# Patient Record
Sex: Female | Born: 1970
Health system: Southern US, Community
[De-identification: ages and names within clinical notes are randomized; demographics above are authoritative.]

## PROBLEM LIST (undated history)

## (undated) DIAGNOSIS — D649 Anemia, unspecified: Secondary | ICD-10-CM

## (undated) DIAGNOSIS — J449 Chronic obstructive pulmonary disease, unspecified: Secondary | ICD-10-CM

## (undated) DIAGNOSIS — K56609 Unspecified intestinal obstruction, unspecified as to partial versus complete obstruction: Secondary | ICD-10-CM

## (undated) DIAGNOSIS — F32A Depression, unspecified: Secondary | ICD-10-CM

## (undated) DIAGNOSIS — F329 Major depressive disorder, single episode, unspecified: Secondary | ICD-10-CM

## (undated) DIAGNOSIS — Z9289 Personal history of other medical treatment: Secondary | ICD-10-CM

## (undated) HISTORY — PX: TUBAL LIGATION: SHX77

## (undated) HISTORY — PX: LACERATION REPAIR: SHX5168

## (undated) HISTORY — PX: ABDOMINAL HYSTERECTOMY: SHX81

## (undated) HISTORY — PX: WISDOM TOOTH EXTRACTION: SHX21

---

## 2010-11-04 DIAGNOSIS — K56609 Unspecified intestinal obstruction, unspecified as to partial versus complete obstruction: Secondary | ICD-10-CM

## 2010-11-04 HISTORY — DX: Unspecified intestinal obstruction, unspecified as to partial versus complete obstruction: K56.609

## 2010-11-04 HISTORY — PX: SMALL INTESTINE SURGERY: SHX150

## 2011-11-28 ENCOUNTER — Emergency Department (HOSPITAL_COMMUNITY): Payer: Self-pay

## 2011-11-28 ENCOUNTER — Emergency Department (HOSPITAL_COMMUNITY)
Admission: EM | Admit: 2011-11-28 | Discharge: 2011-11-28 | Disposition: A | Discharge status: Home / Self Care | Payer: Self-pay | Attending: Emergency Medicine | Admitting: Emergency Medicine

## 2011-11-28 ENCOUNTER — Encounter (HOSPITAL_COMMUNITY): Payer: Self-pay | Admitting: Emergency Medicine

## 2011-11-28 DIAGNOSIS — R0602 Shortness of breath: Secondary | ICD-10-CM | POA: Insufficient documentation

## 2011-11-28 DIAGNOSIS — R05 Cough: Secondary | ICD-10-CM | POA: Insufficient documentation

## 2011-11-28 DIAGNOSIS — R079 Chest pain, unspecified: Secondary | ICD-10-CM | POA: Insufficient documentation

## 2011-11-28 DIAGNOSIS — R059 Cough, unspecified: Secondary | ICD-10-CM | POA: Insufficient documentation

## 2011-11-28 HISTORY — DX: Anemia, unspecified: D64.9

## 2011-11-28 HISTORY — DX: Unspecified intestinal obstruction, unspecified as to partial versus complete obstruction: K56.609

## 2011-11-28 MED ORDER — HYDROCOD POLST-CHLORPHEN POLST 10-8 MG/5ML PO LQCR: 5.0000 mL | Freq: Every evening | ORAL | Status: DC | PRN

## 2011-11-28 NOTE — ED Notes (Signed)
Pt reports wheezing a lot at night or when lying down. No wheezing at this time.

## 2011-11-28 NOTE — ED Provider Notes (Signed)
History     CSN: [id number]  Arrival date & time 11/28/11  1242   First MD Initiated Contact with Patient 11/28/11 1308      Chief Complaint  Patient presents with  . Cough    (Consider location/radiation/quality/duration/timing/severity/associated sxs/prior treatment) Patient is a 41 y.o. female presenting with cough. The history is provided by the patient.  Cough This is a new problem. Episode onset: One month ago. The problem occurs hourly. The problem has not changed since onset.The cough is non-productive. There has been no fever. Associated symptoms include rhinorrhea. Pertinent negatives include no chest pain, no chills, no sweats, no weight loss, no ear congestion, no ear pain, no headaches, no sore throat, no myalgias, no shortness of breath, no wheezing and no eye redness. She has tried decongestants and cough syrup for the symptoms. The treatment provided mild relief. She is not a smoker. Her past medical history does not include COPD, emphysema or asthma.    Past Medical History  Diagnosis Date  . Anemia   . Bowel obstruction 2012    Past Surgical History  Procedure Date  . Abdominal hysterectomy   . Small intestine surgery 2012    No family history on file.  History  Substance Use Topics  . Smoking status: Never Smoker   . Smokeless tobacco: Not on file  . Alcohol Use: No     Review of Systems  Constitutional: Negative for fever, chills and weight loss.  HENT: Positive for rhinorrhea. Negative for ear pain, congestion, sore throat, trouble swallowing, neck pain and neck stiffness.   Eyes: Negative for pain and redness.  Respiratory: Positive for cough. Negative for shortness of breath and wheezing.   Cardiovascular: Negative for chest pain.  Gastrointestinal: Negative for nausea, vomiting and abdominal pain.  Musculoskeletal: Negative for myalgias and back pain.  Skin: Negative for rash.  Neurological: Negative for dizziness, syncope, weakness and  headaches.  Hematological:       Hx anemia  Psychiatric/Behavioral: Negative for confusion.    Allergies  Review of patient's allergies indicates no known allergies.  Home Medications   Current Outpatient Rx  Name Route Sig Dispense Refill  . DEXTROMETHORPHAN POLISTIREX ER 30 MG/5ML PO LQCR Oral Take 60 mg by mouth as needed.    Marland Kitchen VICKS DAYQUIL COLD & FLU PO Oral Take 2 capsules by mouth 2 (two) times daily.    . GUAIFENESIN 100 MG/5ML PO LIQD Oral Take 200 mg by mouth 2 (two) times daily.      BP 139/99  Pulse 82  Temp(Src) 97.5 F (36.4 C) (Oral)  Resp 16  SpO2 100%  Physical Exam  Nursing note and vitals reviewed. Constitutional: She is oriented to person, place, and time. She appears well-developed and well-nourished. No distress.  HENT:  Head: Normocephalic and atraumatic.  Right Ear: External ear normal.  Left Ear: External ear normal.  Nose: Nose normal.  Mouth/Throat: Oropharynx is clear and moist. No oropharyngeal exudate.       Bilateral TM normal  Eyes: Conjunctivae are normal. Pupils are equal, round, and reactive to light. Right eye exhibits no discharge. Left eye exhibits no discharge.  Neck: Normal range of motion. Neck supple.  Cardiovascular: Normal rate, regular rhythm and normal heart sounds.   No murmur heard. Pulmonary/Chest: Effort normal and breath sounds normal. No respiratory distress. She has no wheezes. She exhibits no tenderness.  Abdominal: Soft. Bowel sounds are normal. She exhibits no distension. There is no tenderness.  Musculoskeletal: She exhibits  no edema and no tenderness.  Lymphadenopathy:    She has no cervical adenopathy.  Neurological: She is alert and oriented to person, place, and time. No cranial nerve deficit.  Skin: Skin is warm and dry.  Psychiatric: She has a normal mood and affect.    ED Course  Procedures (including critical care time)  Labs Reviewed - No data to display Dg Chest 2 View  11/28/2011  *RADIOLOGY  REPORT*  Clinical Data: Cough, chest pain and shortness of breath.  CHEST - 2 VIEW  Comparison: None.  Findings: Trachea is midline.  Heart size normal.  Lungs are clear. No pleural fluid.  IMPRESSION: No acute findings.  Original Report Authenticated By: Reyes Ivan, M.D.    Dx 1:cough   MDM  Cough x 1 month. CXR reviewed, no evidence of pneumonia or other process to explain cough. When questioned, pt admits to having a bad/acidic taste in her throat in the mornings on occasion but denies having any trouble with indigestion or reflux. Suspect allergic cause of cough vs poss mild reflux. Advised a trial use of OTC antacid medication to see if it helps symptoms. Will also give rx for tussionex for nighttime use as she reports poor sleeping secondary to cough.        [address] [place], [place] 11/28/11 [phone]

## 2011-11-28 NOTE — ED Notes (Signed)
Pt reports cough x 30 days, states has been taking robitussin and delsym PRN when cough was bad. Pt states cough has always been nonproductive and when she coughs a lot it "makes me have a headache". Pt states occasional runny nose. Pt reports difficulty sleeping r/t cough.

## 2011-11-29 NOTE — ED Provider Notes (Signed)
Medical screening examination/treatment/procedure(s) were performed by non-physician practitioner and as supervising physician I was immediately available for consultation/collaboration.  Raeford Razor, MD 11/29/11 [phone]

## 2012-05-20 ENCOUNTER — Emergency Department (HOSPITAL_COMMUNITY)
Admission: EM | Admit: 2012-05-20 | Discharge: 2012-05-20 | Disposition: A | Discharge status: Home / Self Care | Payer: Medicaid Other | Attending: Emergency Medicine | Admitting: Emergency Medicine

## 2012-05-20 ENCOUNTER — Encounter (HOSPITAL_COMMUNITY): Payer: Self-pay | Admitting: Emergency Medicine

## 2012-05-20 DIAGNOSIS — Z8249 Family history of ischemic heart disease and other diseases of the circulatory system: Secondary | ICD-10-CM | POA: Insufficient documentation

## 2012-05-20 DIAGNOSIS — Z833 Family history of diabetes mellitus: Secondary | ICD-10-CM | POA: Insufficient documentation

## 2012-05-20 DIAGNOSIS — K0510 Chronic gingivitis, plaque induced: Secondary | ICD-10-CM | POA: Insufficient documentation

## 2012-05-20 DIAGNOSIS — Z90710 Acquired absence of both cervix and uterus: Secondary | ICD-10-CM | POA: Insufficient documentation

## 2012-05-20 MED ORDER — HYDROCODONE-ACETAMINOPHEN 5-325 MG PO TABS: 1.0000 | ORAL_TABLET | Freq: Four times a day (QID) | ORAL | Status: AC | PRN

## 2012-05-20 MED ORDER — CHLORHEXIDINE GLUCONATE 0.12 % MT SOLN: OROMUCOSAL | Status: DC

## 2012-05-20 MED ORDER — IBUPROFEN 800 MG PO TABS
800.0000 mg | ORAL_TABLET | Freq: Once | ORAL | Status: AC
  Administered 2012-05-20: 800 mg via ORAL
  Filled 2012-05-20: qty 1

## 2012-05-20 NOTE — ED Provider Notes (Signed)
History     CSN: [id number]  Arrival date & time 05/20/12  1478   First MD Initiated Contact with Patient 05/20/12 [phone]      Chief Complaint  Patient presents with  . Dental Pain    (Consider location/radiation/quality/duration/timing/severity/associated sxs/prior treatment) Patient is a 41 y.o. female presenting with tooth pain. The history is provided by the patient.  Dental PainThe primary symptoms include mouth pain. Primary symptoms do not include dental injury, oral bleeding, headaches, fever or sore throat. The symptoms began yesterday. The symptoms are new. The symptoms occur constantly.  Affected locations include: gum(s) and teeth (lower central teeth (canines and incisors primarily)).  Additional symptoms include: gum swelling and gum tenderness. Additional symptoms do not include: trismus, facial swelling, excessive salivation, dry mouth and drooling. Medical issues do not include: alcohol problem, smoking and periodontal disease.  Pt brushes regularly.  Does not floss.  Has not seen a dentist in a while for dental care.  Past Medical History  Diagnosis Date  . Anemia   . Bowel obstruction 2012    Past Surgical History  Procedure Date  . Abdominal hysterectomy   . Small intestine surgery 2012    Family History  Problem Relation Age of Onset  . Diabetes Other   . Hypertension Other     History  Substance Use Topics  . Smoking status: Never Smoker   . Smokeless tobacco: Not on file  . Alcohol Use: No    OB History    Grav Para Term Preterm Abortions TAB SAB Ect Mult Living                  Review of Systems  Constitutional: Negative for fever.  HENT: Negative for sore throat, facial swelling and drooling.   Neurological: Negative for headaches.  All other systems reviewed and are negative.    Allergies  Review of patient's allergies indicates no known allergies.  Home Medications   Current Outpatient Rx  Name Route Sig Dispense Refill  .  ACETAMINOPHEN 500 MG PO TABS Oral Take 500 mg by mouth every 6 (six) hours as needed.    Marland Kitchen HYDROCOD POLST-CPM POLST ER 10-8 MG/5ML PO LQCR Oral Take 5 mLs by mouth at bedtime as needed. 100 mL 0    BP 135/87  Pulse 80  Temp 98.1 F (36.7 C) (Oral)  Resp 16  SpO2 99%  Physical Exam  Nursing note and vitals reviewed. Constitutional: She appears well-developed and well-nourished. No distress.  HENT:  Head: Normocephalic and atraumatic. No trismus in the jaw.  Right Ear: External ear normal.  Left Ear: External ear normal.  Mouth/Throat: Uvula is midline. Mucous membranes are not pale and not cyanotic. No dental abscesses, uvula swelling or dental caries. No oropharyngeal exudate or tonsillar abscesses.       gingival inflammation surrounding lower central incisors and canines, increased plaque and debris between teeth, halitosis  Eyes: Conjunctivae are normal. Right eye exhibits no discharge. Left eye exhibits no discharge. No scleral icterus.  Neck: Neck supple. No tracheal deviation present.  Cardiovascular: Normal rate.   Pulmonary/Chest: Effort normal. No stridor. No respiratory distress.  Musculoskeletal: She exhibits no edema.  Neurological: She is alert. Cranial nerve deficit: no gross deficits.  Skin: Skin is warm and dry. No rash noted.  Psychiatric: She has a normal mood and affect.    ED Course  Procedures (including critical care time)  Labs Reviewed - No data to display No results found.  MDM  Symptoms consistent with gingivitis.  Recc salt water gargles.  Will rx chlorhexidine. Continue gentle dental care.  Follow up with a dentist asap.        Celene Kras, MD 05/20/12 0730

## 2012-05-20 NOTE — ED Notes (Signed)
Pt states she is having mouth and gum pain  Pt states she woke up yesterday morning with pain that has progressively gotten worse  Pt guarding mouth  Pt states she does not think she has any bad teeth

## 2012-10-18 ENCOUNTER — Emergency Department (HOSPITAL_COMMUNITY): Payer: Medicaid Other

## 2012-10-18 ENCOUNTER — Encounter (HOSPITAL_COMMUNITY): Payer: Self-pay | Admitting: *Deleted

## 2012-10-18 ENCOUNTER — Emergency Department (HOSPITAL_COMMUNITY)
Admission: EM | Admit: 2012-10-18 | Discharge: 2012-10-18 | Disposition: A | Discharge status: Home / Self Care | Payer: Medicaid Other | Attending: Emergency Medicine | Admitting: Emergency Medicine

## 2012-10-18 DIAGNOSIS — Z862 Personal history of diseases of the blood and blood-forming organs and certain disorders involving the immune mechanism: Secondary | ICD-10-CM | POA: Insufficient documentation

## 2012-10-18 DIAGNOSIS — R7401 Elevation of levels of liver transaminase levels: Secondary | ICD-10-CM | POA: Insufficient documentation

## 2012-10-18 DIAGNOSIS — Z8719 Personal history of other diseases of the digestive system: Secondary | ICD-10-CM | POA: Insufficient documentation

## 2012-10-18 DIAGNOSIS — R7402 Elevation of levels of lactic acid dehydrogenase (LDH): Secondary | ICD-10-CM | POA: Insufficient documentation

## 2012-10-18 DIAGNOSIS — R4789 Other speech disturbances: Secondary | ICD-10-CM | POA: Insufficient documentation

## 2012-10-18 DIAGNOSIS — R109 Unspecified abdominal pain: Secondary | ICD-10-CM | POA: Insufficient documentation

## 2012-10-18 DIAGNOSIS — K921 Melena: Secondary | ICD-10-CM | POA: Insufficient documentation

## 2012-10-18 LAB — CBC WITH DIFFERENTIAL/PLATELET
Basophils Relative: 0 % (ref 0–1)
Eosinophils Absolute: 0.2 10*3/uL (ref 0.0–0.7)
MCH: 27.9 pg (ref 26.0–34.0)
MCHC: 34.3 g/dL (ref 30.0–36.0)
Neutrophils Relative %: 64 % (ref 43–77)
Platelets: 279 10*3/uL (ref 150–400)
RBC: 5.78 MIL/uL — ABNORMAL HIGH (ref 3.87–5.11)

## 2012-10-18 LAB — COMPREHENSIVE METABOLIC PANEL
ALT: 54 U/L — ABNORMAL HIGH (ref 0–35)
Albumin: 4.3 g/dL (ref 3.5–5.2)
Alkaline Phosphatase: 157 U/L — ABNORMAL HIGH (ref 39–117)
Potassium: 3.5 mEq/L (ref 3.5–5.1)
Sodium: 137 mEq/L (ref 135–145)
Total Protein: 9.1 g/dL — ABNORMAL HIGH (ref 6.0–8.3)

## 2012-10-18 NOTE — ED Notes (Signed)
Patient discharge to home.  Pain at tolerable level.  Vitals stable.  Discussed discharge instructions.  Patient verbalized understanding.  No other concerns at this time.  Evelyn Kline 6:09 PM 10/18/2012

## 2012-10-18 NOTE — ED Notes (Signed)
Patient stated she informed the MD that she is unable to get the abdominal US due to needing to pick up children from school.  Will follow up with Md on discharge instructions.  Barrie Lyme 6:12 PM 10/18/2012

## 2012-10-18 NOTE — ED Provider Notes (Signed)
History     CSN: [id number]  Arrival date & time 10/18/12  1342   First MD Initiated Contact with Patient 10/18/12 1635      Chief Complaint  Patient presents with  . Diarrhea    (Consider location/radiation/quality/duration/timing/severity/associated sxs/prior treatment) HPI The patient presents with concerns of ongoing loose stool, and possible blood in her stool today.  She notes her symptoms began approximately 2 months ago.  Since onset symptoms have been persistent.  There was no clear precipitant.  She notes mild, non-associated, intermittent, diffuse, crampy abdominal pain.  This is possibly improved with OTC medication.  She denies concurrent fevers, chills, vomiting, anorexia.  She has seen a specialist during this timeframe.  Today the patient one episode of visible blood with stool.  She denies current lightheadedness, syncope, chest pain, dyspnea, or any other new focal changes in her condition. Past Medical History  Diagnosis Date  . Anemia   . Bowel obstruction 2012    Past Surgical History  Procedure Date  . Abdominal hysterectomy   . Small intestine surgery 2012    Family History  Problem Relation Age of Onset  . Diabetes Other   . Hypertension Other     History  Substance Use Topics  . Smoking status: Never Smoker   . Smokeless tobacco: Not on file  . Alcohol Use: No    OB History    Grav Para Term Preterm Abortions TAB SAB Ect Mult Living                  Review of Systems  Constitutional:       Per HPI, otherwise negative  HENT:       Per HPI, otherwise negative  Eyes: Negative.   Respiratory:       Per HPI, otherwise negative  Cardiovascular:       Per HPI, otherwise negative  Gastrointestinal: Positive for blood in stool. Negative for vomiting.  Genitourinary: Negative.   Musculoskeletal:       Per HPI, otherwise negative  Skin: Negative.   Neurological: Negative for syncope.    Allergies  Review of patient's allergies  indicates no known allergies.  Home Medications   Current Outpatient Rx  Name  Route  Sig  Dispense  Refill  . SERTRALINE HCL 50 MG PO TABS   Oral   Take 50 mg by mouth every morning.           BP 131/90  Pulse 86  Temp 98.6 F (37 C) (Oral)  Resp 20  Ht 5\' 6"  (1.676 m)  Wt 156 lb (70.761 kg)  BMI 25.18 kg/m2  SpO2 99%  Physical Exam  Nursing note and vitals reviewed. Constitutional: She is oriented to person, place, and time. She appears well-developed and well-nourished. No distress.  HENT:  Head: Normocephalic and atraumatic.  Eyes: Conjunctivae normal and EOM are normal.  Cardiovascular: Normal rate and regular rhythm.   Pulmonary/Chest: Effort normal and breath sounds normal. No stridor. No respiratory distress.  Abdominal: She exhibits no distension.  Musculoskeletal: She exhibits no edema.  Neurological: She is alert and oriented to person, place, and time. No cranial nerve deficit.  Skin: Skin is warm and dry.  Psychiatric: She has a normal mood and affect. Her speech is delayed. She is slowed and withdrawn.    ED Course  Procedures (including critical care time)  Labs Reviewed  CBC WITH DIFFERENTIAL - Abnormal; Notable for the following:    RBC 5.78 (*)  Hemoglobin 16.1 (*)     HCT 46.9 (*)     All other components within normal limits  COMPREHENSIVE METABOLIC PANEL - Abnormal; Notable for the following:    Total Protein 9.1 (*)     AST 51 (*)     ALT 54 (*)     Alkaline Phosphatase 157 (*)     All other components within normal limits   No results found.   1. Elevated transaminase level     6:03 PM The patient requests D/c due to family concerns.  She was informed of results thus far, and the need for additional studies and PE (rectal).  She states that she will f/u tomorrow with her gastroenterologist.  MDM  This patient presents with concerns of ongoing loose stool, new blood with defecation.  On exam she is in no distress.  Patient  signs are unremarkable.  He has a gastroenterologist, with them she has been investigating her newly chronic loose stool.  On evaluation here, she was in no distress.  The patient's initial evaluation is notable for the elevation of elevated transaminases.  I recommend additional evaluation with ultrasound.  The patient deferred this, as she has family commitments, and requested discharge.  She is advised to return for any concerning changes in her condition, and to followup with her gastroenterologist otherwise.        Gerhard Munch, MD 10/18/12 [phone]

## 2012-10-18 NOTE — ED Notes (Signed)
[id number]<JY> Expected date:<BR> Expected time:<BR> Means of arrival:<BR> Comments:<BR> [facility] triage

## 2012-10-18 NOTE — ED Notes (Signed)
Pt reports diarrhea x's a few months and noticed blood in stool today. Pt also reports slight abd pain.

## 2012-11-16 ENCOUNTER — Other Ambulatory Visit: Payer: Self-pay | Admitting: Gastroenterology

## 2012-11-16 DIAGNOSIS — R102 Pelvic and perineal pain: Secondary | ICD-10-CM

## 2012-11-16 DIAGNOSIS — R109 Unspecified abdominal pain: Secondary | ICD-10-CM

## 2012-11-19 ENCOUNTER — Ambulatory Visit
Admission: RE | Admit: 2012-11-19 | Discharge: 2012-11-19 | Disposition: A | Discharge status: Home / Self Care | Payer: Medicaid Other | Source: Ambulatory Visit | Attending: Gastroenterology | Admitting: Gastroenterology

## 2012-11-19 ENCOUNTER — Other Ambulatory Visit: Payer: Medicaid Other

## 2012-11-19 DIAGNOSIS — R109 Unspecified abdominal pain: Secondary | ICD-10-CM

## 2012-11-19 DIAGNOSIS — R102 Pelvic and perineal pain: Secondary | ICD-10-CM

## 2012-11-22 ENCOUNTER — Emergency Department (INDEPENDENT_AMBULATORY_CARE_PROVIDER_SITE_OTHER)
Admission: EM | Admit: 2012-11-22 | Discharge: 2012-11-22 | Disposition: A | Discharge status: Home / Self Care | Payer: Medicaid Other | Source: Home / Self Care | Attending: Emergency Medicine | Admitting: Emergency Medicine

## 2012-11-22 ENCOUNTER — Encounter (HOSPITAL_COMMUNITY): Payer: Self-pay | Admitting: *Deleted

## 2012-11-22 DIAGNOSIS — R51 Headache: Secondary | ICD-10-CM

## 2012-11-22 DIAGNOSIS — J069 Acute upper respiratory infection, unspecified: Secondary | ICD-10-CM

## 2012-11-22 HISTORY — DX: Major depressive disorder, single episode, unspecified: F32.9

## 2012-11-22 HISTORY — DX: Depression, unspecified: F32.A

## 2012-11-22 LAB — POCT URINALYSIS DIP (DEVICE)
Bilirubin Urine: NEGATIVE
Hgb urine dipstick: NEGATIVE
Leukocytes, UA: NEGATIVE
pH: 5.5 (ref 5.0–8.0)

## 2012-11-22 MED ORDER — KETOROLAC TROMETHAMINE 60 MG/2ML IM SOLN
60.0000 mg | Freq: Once | INTRAMUSCULAR | Status: AC
  Administered 2012-11-22: 60 mg via INTRAMUSCULAR

## 2012-11-22 MED ORDER — KETOROLAC TROMETHAMINE 60 MG/2ML IM SOLN
INTRAMUSCULAR | Status: AC
  Filled 2012-11-22: qty 2

## 2012-11-22 NOTE — ED Notes (Signed)
Patient complains of headache that started yesterday with no relief from ibuprofen; patient also states that she have fever/chills, chest pain and congestion with cough, shortness of breath, back and neck pain from headache that all started Saturday. Denies nausea, vomiting, diarrhea.

## 2012-11-22 NOTE — ED Provider Notes (Signed)
History     CSN: [id number]  Arrival date & time 11/22/12  1213   First MD Initiated Contact with Patient 11/22/12 1428      Chief Complaint  Patient presents with  . Headache  . URI    (Consider location/radiation/quality/duration/timing/severity/associated sxs/prior treatment) Patient is a 42 y.o. female presenting with headaches and URI. The history is provided by the patient.  Headache The primary symptoms include headaches. Primary symptoms do not include paresthesias. The symptoms began yesterday. The symptoms are unchanged. The neurological symptoms are focal.  Location/region(s) of the headache: temporal. The headache is not associated with aura, neck stiffness, paresthesias or weakness.  Additional symptoms include vertigo. Additional symptoms do not include neck stiffness, weakness, aura or tinnitus. Associated medical issues comments: depression.  URI The primary symptoms include headaches.  The headache is not associated with aura, neck stiffness, paresthesias or weakness.  works as Scientist, forensic, additional complains of mid back pain.  Past Medical History  Diagnosis Date  . Anemia   . Bowel obstruction 2012  . Depression     Past Surgical History  Procedure Date  . Abdominal hysterectomy   . Small intestine surgery 2012    Family History  Problem Relation Age of Onset  . Diabetes Other   . Hypertension Other     History  Substance Use Topics  . Smoking status: Never Smoker   . Smokeless tobacco: Not on file  . Alcohol Use: No    OB History    Grav Para Term Preterm Abortions TAB SAB Ect Mult Living                  Review of Systems  HENT: Negative for neck stiffness and tinnitus.   Neurological: Positive for vertigo and headaches. Negative for weakness and paresthesias.  All other systems reviewed and are negative.    Allergies  Review of patient's allergies indicates no known allergies.  Home Medications   Current  Outpatient Rx  Name  Route  Sig  Dispense  Refill  . SERTRALINE HCL 50 MG PO TABS   Oral   Take 50 mg by mouth every morning.           BP 114/73  Pulse 96  Temp 98 F (36.7 C) (Oral)  Resp 14  SpO2 96%  Physical Exam  Nursing note and vitals reviewed. Constitutional: She is oriented to person, place, and time. Vital signs are normal. She appears well-developed and well-nourished. She is active and cooperative.  HENT:  Head: Normocephalic.  Right Ear: External ear normal.  Left Ear: External ear normal.  Nose: Nose normal. Right sinus exhibits no maxillary sinus tenderness and no frontal sinus tenderness. Left sinus exhibits no maxillary sinus tenderness and no frontal sinus tenderness.  Mouth/Throat: Uvula is midline, oropharynx is clear and moist and mucous membranes are normal.       Bilateral cerumen impaction  Eyes: Conjunctivae normal and EOM are normal. Pupils are equal, round, and reactive to light. No scleral icterus.  Neck: Trachea normal and normal range of motion. Neck supple. No spinous process tenderness and no muscular tenderness present.  Cardiovascular: Normal rate, regular rhythm, normal heart sounds, intact distal pulses and normal pulses.   Pulmonary/Chest: Effort normal and breath sounds normal.  Musculoskeletal: Normal range of motion.       Cervical back: Normal.       Thoracic back: Normal.       Lumbar back: Normal.  Lymphadenopathy:  Head (right side): No submental, no submandibular, no tonsillar, no preauricular, no posterior auricular and no occipital adenopathy present.       Head (left side): No submental, no submandibular, no tonsillar, no preauricular, no posterior auricular and no occipital adenopathy present.    She has no cervical adenopathy.  Neurological: She is alert and oriented to person, place, and time. She has normal strength. No cranial nerve deficit or sensory deficit. GCS eye subscore is 4. GCS verbal subscore is 5. GCS motor  subscore is 6.  Skin: Skin is warm and dry. No rash noted.  Psychiatric: She has a normal mood and affect. Her speech is normal and behavior is normal. Judgment and thought content normal. Cognition and memory are normal.    ED Course  Procedures (including critical care time)  Labs Reviewed  POCT URINALYSIS DIP (DEVICE) - Abnormal; Notable for the following:    Ketones, ur TRACE (*)     Protein, ur 30 (*)     All other components within normal limits  POCT RAPID STREP A ([facility] CARE ONLY)  POCT INFECTIOUS MONO SCREEN  POCT PREGNANCY, URINE   No results found.   1. Headache   2. URI (upper respiratory infection)       MDM  Patient with depressed affect, nonspecific poor historian of symptoms.  No abnormal neurological findings on exam.  Believe symptoms may be related to uri mixed with probable viral syndrome.   Toradol 60mg  IM administered in office for headache and midback pain.  1632 pain improved. Medication as prescribed, follow up with PCP if symptoms are not improved.  Discussed reasons to return sooner for further evaluation.          Johnsie Kindred, NP 11/22/12 [phone]

## 2012-11-23 NOTE — ED Provider Notes (Signed)
Medical screening examination/treatment/procedure(s) were performed by non-physician practitioner and as supervising physician I was immediately available for consultation/collaboration.  Leslee Home, M.D.   Reuben Likes, MD 11/23/12 [phone]

## 2012-11-24 ENCOUNTER — Encounter (HOSPITAL_COMMUNITY): Payer: Self-pay

## 2012-11-24 ENCOUNTER — Emergency Department (HOSPITAL_COMMUNITY)
Admission: EM | Admit: 2012-11-24 | Discharge: 2012-11-24 | Disposition: A | Discharge status: Home / Self Care | Payer: Medicaid Other | Attending: Emergency Medicine | Admitting: Emergency Medicine

## 2012-11-24 DIAGNOSIS — Z79899 Other long term (current) drug therapy: Secondary | ICD-10-CM | POA: Insufficient documentation

## 2012-11-24 DIAGNOSIS — Z8719 Personal history of other diseases of the digestive system: Secondary | ICD-10-CM | POA: Insufficient documentation

## 2012-11-24 DIAGNOSIS — R5383 Other fatigue: Secondary | ICD-10-CM | POA: Insufficient documentation

## 2012-11-24 DIAGNOSIS — F3289 Other specified depressive episodes: Secondary | ICD-10-CM | POA: Insufficient documentation

## 2012-11-24 DIAGNOSIS — R112 Nausea with vomiting, unspecified: Secondary | ICD-10-CM | POA: Insufficient documentation

## 2012-11-24 DIAGNOSIS — B349 Viral infection, unspecified: Secondary | ICD-10-CM

## 2012-11-24 DIAGNOSIS — Z862 Personal history of diseases of the blood and blood-forming organs and certain disorders involving the immune mechanism: Secondary | ICD-10-CM | POA: Insufficient documentation

## 2012-11-24 DIAGNOSIS — R51 Headache: Secondary | ICD-10-CM | POA: Insufficient documentation

## 2012-11-24 DIAGNOSIS — J029 Acute pharyngitis, unspecified: Secondary | ICD-10-CM | POA: Insufficient documentation

## 2012-11-24 DIAGNOSIS — M549 Dorsalgia, unspecified: Secondary | ICD-10-CM | POA: Insufficient documentation

## 2012-11-24 DIAGNOSIS — R5381 Other malaise: Secondary | ICD-10-CM | POA: Insufficient documentation

## 2012-11-24 DIAGNOSIS — B9789 Other viral agents as the cause of diseases classified elsewhere: Secondary | ICD-10-CM | POA: Insufficient documentation

## 2012-11-24 DIAGNOSIS — F329 Major depressive disorder, single episode, unspecified: Secondary | ICD-10-CM | POA: Insufficient documentation

## 2012-11-24 MED ORDER — HYDROCODONE-ACETAMINOPHEN 5-325 MG PO TABS: ORAL_TABLET | ORAL | Status: DC

## 2012-11-24 MED ORDER — ONDANSETRON 8 MG PO TBDP: 8.0000 mg | ORAL_TABLET | Freq: Once | ORAL | Status: DC

## 2012-11-24 MED ORDER — AEROCHAMBER Z-STAT PLUS/MEDIUM MISC
1.0000 | Freq: Once | Status: AC
  Administered 2012-11-24: 1

## 2012-11-24 MED ORDER — ONDANSETRON 4 MG PO TBDP
ORAL_TABLET | ORAL | Status: AC
  Administered 2012-11-24: 8 mg
  Filled 2012-11-24: qty 1

## 2012-11-24 MED ORDER — ONDANSETRON HCL 4 MG/2ML IJ SOLN: 4.0000 mg | Freq: Once | INTRAMUSCULAR | Status: DC

## 2012-11-24 MED ORDER — ONDANSETRON 4 MG PO TBDP
ORAL_TABLET | ORAL | Status: AC
  Filled 2012-11-24: qty 1

## 2012-11-24 MED ORDER — KETOROLAC TROMETHAMINE 60 MG/2ML IM SOLN
60.0000 mg | Freq: Once | INTRAMUSCULAR | Status: AC
  Administered 2012-11-24: 60 mg via INTRAMUSCULAR
  Filled 2012-11-24: qty 2

## 2012-11-24 MED ORDER — ALBUTEROL SULFATE HFA 108 (90 BASE) MCG/ACT IN AERS
2.0000 | INHALATION_SPRAY | RESPIRATORY_TRACT | Status: DC | PRN
  Administered 2012-11-24: 2 via RESPIRATORY_TRACT
  Filled 2012-11-24: qty 6.7

## 2012-11-24 MED ORDER — GUAIFENESIN 100 MG/5ML PO SYRP: 100.0000 mg | ORAL_SOLUTION | ORAL | Status: DC | PRN

## 2012-11-24 NOTE — ED Notes (Addendum)
Patient reports back pain, vomiting, and a non productive cough. Patient denies dysuria, fever, or diarrhea. Patient was seen at [facility] urgent care on the 11/23/11 and was diagnosed with URI and was given a "shot for back pain." patient states she does not feel any better.

## 2012-11-24 NOTE — ED Provider Notes (Signed)
History     CSN: [id number]  Arrival date & time 11/24/12  1478   First MD Initiated Contact with Patient 11/24/12 [phone]      Chief Complaint  Patient presents with  . Back Pain  . Cough  . Emesis    (Consider location/radiation/quality/duration/timing/severity/associated sxs/prior treatment) HPI Comments: Patient presents with 4 days of cough, vomiting, and back pain in her middle back. She was seen at urgent care 2 days ago with the same complaints. She states that she is not any better. Patient denies fever, diarrhea. Patient states that she has not been able to keep down any fluids or solids for 3 days. She denies abdominal pain. Patient states that she took Vicodin that she had left over from oral surgery to "get rid of the pain". Cough is nonproductive. Onset of symptoms gradual. Course is constant. Nothing makes symptoms better worse. She's been taking over-the-counter medications without relief as well.  The history is provided by the patient.    Past Medical History  Diagnosis Date  . Anemia   . Bowel obstruction 2012  . Depression     Past Surgical History  Procedure Date  . Abdominal hysterectomy   . Small intestine surgery 2012    Family History  Problem Relation Age of Onset  . Diabetes Other   . Hypertension Other     History  Substance Use Topics  . Smoking status: Never Smoker   . Smokeless tobacco: Never Used  . Alcohol Use: No    OB History    Grav Para Term Preterm Abortions TAB SAB Ect Mult Living                  Review of Systems  Constitutional: Positive for fatigue. Negative for fever and chills.  HENT: Positive for sore throat. Negative for ear pain, congestion, rhinorrhea, neck stiffness and sinus pressure.   Eyes: Negative for redness.  Respiratory: Positive for cough and wheezing. Negative for shortness of breath.   Cardiovascular: Negative for chest pain.  Gastrointestinal: Positive for nausea and vomiting. Negative for abdominal  pain and diarrhea.  Genitourinary: Negative for dysuria.  Musculoskeletal: Positive for back pain. Negative for myalgias.  Skin: Negative for rash.  Neurological: Positive for headaches.  Hematological: Negative for adenopathy.    Allergies  Review of patient's allergies indicates no known allergies.  Home Medications   Current Outpatient Rx  Name  Route  Sig  Dispense  Refill  . SERTRALINE HCL 50 MG PO TABS   Oral   Take 50 mg by mouth every morning.           BP 109/86  Pulse 73  Temp 98.3 F (36.8 C) (Oral)  Resp 18  SpO2 96%  Physical Exam  Nursing note and vitals reviewed. Constitutional: She appears well-developed and well-nourished.  HENT:  Head: Normocephalic and atraumatic. No trismus in the jaw.  Right Ear: Tympanic membrane, external ear and ear canal normal.  Left Ear: Tympanic membrane, external ear and ear canal normal.  Nose: Nose normal. No mucosal edema or rhinorrhea.  Mouth/Throat: Uvula is midline, oropharynx is clear and moist and mucous membranes are normal. Mucous membranes are not dry. No oral lesions. No uvula swelling. No oropharyngeal exudate, posterior oropharyngeal edema, posterior oropharyngeal erythema or tonsillar abscesses.  Eyes: Conjunctivae normal are normal. Right eye exhibits no discharge. Left eye exhibits no discharge.  Neck: Normal range of motion. Neck supple.  Cardiovascular: Normal rate, regular rhythm and normal heart sounds.  Pulmonary/Chest: Effort normal and breath sounds normal. No respiratory distress. She has no wheezes. She has no rales.  Abdominal: Soft. There is no tenderness.  Musculoskeletal: She exhibits tenderness.       Cervical back: Normal. She exhibits normal range of motion, no tenderness and no bony tenderness.       Thoracic back: She exhibits tenderness. She exhibits normal range of motion and no bony tenderness.       Lumbar back: Normal. She exhibits normal range of motion, no tenderness and no bony  tenderness.       Back:  Lymphadenopathy:    She has no cervical adenopathy.  Neurological: She is alert.  Skin: Skin is warm and dry.  Psychiatric: She exhibits a depressed mood.    ED Course  Procedures (including critical care time)  Labs Reviewed - No data to display No results found.   1. Viral syndrome     10:12 AM Patient seen and examined. Medications ordered.   Vital signs reviewed and are as follows: Filed Vitals:   11/24/12 0923  BP: 109/86  Pulse: 73  Temp: 98.3 F (36.8 C)  Resp: 18   Patient states that she is improved after Toradol. She has been drinking fluids in room without vomiting. Will discharge home with cough medicine and pain medication.  Patient counseled on use of narcotic pain medications. Counseled not to combine these medications with others containing tylenol. Urged not to drink alcohol, drive, or perform any other activities that requires focus while taking these medications. The patient verbalizes understanding and agrees with the plan.  Patient counseled on use of albuterol HFA.  Told to use 1-2 puffs q 4 hours as needed for SOB.   MDM  Patient with viral syndrome. She is tolerating oral fluids in the emergency department. She does not appear clinically dehydrated. Suspect component of bronchitis. Albuterol inhaler given. Patient needs to rest at home. Do not suspect pneumonia. Patient is afebrile. Conservative treatment indicated.       Renne Crigler, [place] 11/24/12 [phone]

## 2012-11-24 NOTE — ED Provider Notes (Signed)
Medical screening examination/treatment/procedure(s) were performed by non-physician practitioner and as supervising physician I was immediately available for consultation/collaboration.  Gerhard Munch, MD 11/24/12 [phone]

## 2013-08-11 ENCOUNTER — Emergency Department (HOSPITAL_BASED_OUTPATIENT_CLINIC_OR_DEPARTMENT_OTHER)
Admission: EM | Admit: 2013-08-11 | Discharge: 2013-08-11 | Disposition: A | Discharge status: Home / Self Care | Payer: Medicaid Other | Attending: Emergency Medicine | Admitting: Emergency Medicine

## 2013-08-11 ENCOUNTER — Encounter (HOSPITAL_BASED_OUTPATIENT_CLINIC_OR_DEPARTMENT_OTHER): Payer: Self-pay | Admitting: Emergency Medicine

## 2013-08-11 ENCOUNTER — Emergency Department (HOSPITAL_BASED_OUTPATIENT_CLINIC_OR_DEPARTMENT_OTHER): Payer: Medicaid Other

## 2013-08-11 DIAGNOSIS — S93401A Sprain of unspecified ligament of right ankle, initial encounter: Secondary | ICD-10-CM

## 2013-08-11 DIAGNOSIS — S93409A Sprain of unspecified ligament of unspecified ankle, initial encounter: Secondary | ICD-10-CM | POA: Insufficient documentation

## 2013-08-11 DIAGNOSIS — Y9289 Other specified places as the place of occurrence of the external cause: Secondary | ICD-10-CM | POA: Insufficient documentation

## 2013-08-11 DIAGNOSIS — Y939 Activity, unspecified: Secondary | ICD-10-CM | POA: Insufficient documentation

## 2013-08-11 DIAGNOSIS — F3289 Other specified depressive episodes: Secondary | ICD-10-CM | POA: Insufficient documentation

## 2013-08-11 DIAGNOSIS — F329 Major depressive disorder, single episode, unspecified: Secondary | ICD-10-CM | POA: Insufficient documentation

## 2013-08-11 DIAGNOSIS — Z8719 Personal history of other diseases of the digestive system: Secondary | ICD-10-CM | POA: Insufficient documentation

## 2013-08-11 DIAGNOSIS — Z79899 Other long term (current) drug therapy: Secondary | ICD-10-CM | POA: Insufficient documentation

## 2013-08-11 DIAGNOSIS — Z862 Personal history of diseases of the blood and blood-forming organs and certain disorders involving the immune mechanism: Secondary | ICD-10-CM | POA: Insufficient documentation

## 2013-08-11 DIAGNOSIS — W010XXA Fall on same level from slipping, tripping and stumbling without subsequent striking against object, initial encounter: Secondary | ICD-10-CM | POA: Insufficient documentation

## 2013-08-11 MED ORDER — IBUPROFEN 800 MG PO TABS: 800.0000 mg | ORAL_TABLET | Freq: Three times a day (TID) | ORAL | Status: DC

## 2013-08-11 NOTE — ED Notes (Signed)
Proper crutches use & education provided.  Patient demostrated walking with crutches.

## 2013-08-11 NOTE — ED Provider Notes (Signed)
CSN: [id number]     Arrival date & time 08/11/13  1344 History   First MD Initiated Contact with Patient 08/11/13 1443     Chief Complaint  Patient presents with  . Ankle Injury   (Consider location/radiation/quality/duration/timing/severity/associated sxs/prior Treatment) Patient is a 42 y.o. female presenting with lower extremity injury. The history is provided by the patient. No language interpreter was used.  Ankle Injury This is a new problem. The current episode started today. The problem occurs constantly. The problem has been gradually worsening. Associated symptoms include joint swelling and myalgias. Nothing aggravates the symptoms. She has tried nothing for the symptoms. The treatment provided moderate relief.  Pt complains of tripping and falling  Past Medical History  Diagnosis Date  . Anemia   . Bowel obstruction 2012  . Depression    Past Surgical History  Procedure Laterality Date  . Abdominal hysterectomy    . Small intestine surgery  2012   Family History  Problem Relation Age of Onset  . Diabetes Other   . Hypertension Other    History  Substance Use Topics  . Smoking status: Never Smoker   . Smokeless tobacco: Never Used  . Alcohol Use: No   OB History   Grav Para Term Preterm Abortions TAB SAB Ect Mult Living                 Review of Systems  Musculoskeletal: Positive for joint swelling and myalgias.  All other systems reviewed and are negative.    Allergies  Review of patient's allergies indicates no known allergies.  Home Medications   Current Outpatient Rx  Name  Route  Sig  Dispense  Refill  . sertraline (ZOLOFT) 50 MG tablet   Oral   Take 50 mg by mouth every morning.          BP 127/84  Temp(Src) 98.8 F (37.1 C)  Resp 18  Ht 5\' 6"  (1.676 m)  Wt 160 lb (72.576 kg)  BMI 25.84 kg/m2  SpO2 100% Physical Exam  Constitutional: She is oriented to person, place, and time. She appears well-developed and well-nourished.   Musculoskeletal: She exhibits tenderness.  Swollen tender right lateral malleolus,  Pain with range of motion,  nv and ns intact  Neurological: She is alert and oriented to person, place, and time. She has normal reflexes.  Skin: Skin is warm.  Psychiatric: She has a normal mood and affect.    ED Course  Procedures (including critical care time) Labs Review Labs Reviewed - No data to display Imaging Review Dg Ankle Complete Right  08/11/2013   CLINICAL DATA:  Pain post injury, lateral malleolus swelling  EXAM: RIGHT ANKLE - COMPLETE 3+ VIEW  COMPARISON:  None.  FINDINGS: Three views of the right ankle submitted. There is cortical irregularity at the tip of distal fibula suspicious for small avulsion fracture. Ankle mortise is preserved. Soft tissue swelling adjacent to lateral malleolus.  IMPRESSION: There is cortical irregularity at the tip of distal fibula suspicious for small avulsion fracture. Ankle mortise is preserved. Soft tissue swelling adjacent to lateral malleolus.   Electronically Signed   By: Natasha Mead M.D.   On: 08/11/2013 14:19    MDM   1. Ankle sprain, right, initial encounter     aso and crutches    Elson Areas, PA-C 08/11/13 1547

## 2013-08-11 NOTE — ED Notes (Signed)
Pt reports falling yesterday, tripped and fell onto grass from standing position. C/o of R ankle pain.

## 2013-08-13 NOTE — ED Provider Notes (Signed)
Medical screening examination/treatment/procedure(s) were performed by non-physician practitioner and as supervising physician I was immediately available for consultation/collaboration.  Hilliard Borges, MD 08/13/13 1911 

## 2013-08-23 ENCOUNTER — Emergency Department (HOSPITAL_BASED_OUTPATIENT_CLINIC_OR_DEPARTMENT_OTHER)
Admission: EM | Admit: 2013-08-23 | Discharge: 2013-08-23 | Disposition: A | Discharge status: Home / Self Care | Payer: Medicaid Other | Attending: Emergency Medicine | Admitting: Emergency Medicine

## 2013-08-23 ENCOUNTER — Encounter (HOSPITAL_BASED_OUTPATIENT_CLINIC_OR_DEPARTMENT_OTHER): Payer: Self-pay | Admitting: Emergency Medicine

## 2013-08-23 ENCOUNTER — Emergency Department (HOSPITAL_BASED_OUTPATIENT_CLINIC_OR_DEPARTMENT_OTHER): Payer: Medicaid Other

## 2013-08-23 DIAGNOSIS — F3289 Other specified depressive episodes: Secondary | ICD-10-CM | POA: Insufficient documentation

## 2013-08-23 DIAGNOSIS — S82899A Other fracture of unspecified lower leg, initial encounter for closed fracture: Secondary | ICD-10-CM | POA: Insufficient documentation

## 2013-08-23 DIAGNOSIS — F329 Major depressive disorder, single episode, unspecified: Secondary | ICD-10-CM | POA: Insufficient documentation

## 2013-08-23 DIAGNOSIS — Z8719 Personal history of other diseases of the digestive system: Secondary | ICD-10-CM | POA: Insufficient documentation

## 2013-08-23 DIAGNOSIS — S82831A Other fracture of upper and lower end of right fibula, initial encounter for closed fracture: Secondary | ICD-10-CM

## 2013-08-23 DIAGNOSIS — Y929 Unspecified place or not applicable: Secondary | ICD-10-CM | POA: Insufficient documentation

## 2013-08-23 DIAGNOSIS — Z791 Long term (current) use of non-steroidal anti-inflammatories (NSAID): Secondary | ICD-10-CM | POA: Insufficient documentation

## 2013-08-23 DIAGNOSIS — X58XXXA Exposure to other specified factors, initial encounter: Secondary | ICD-10-CM | POA: Insufficient documentation

## 2013-08-23 DIAGNOSIS — Z79899 Other long term (current) drug therapy: Secondary | ICD-10-CM | POA: Insufficient documentation

## 2013-08-23 DIAGNOSIS — Y939 Activity, unspecified: Secondary | ICD-10-CM | POA: Insufficient documentation

## 2013-08-23 DIAGNOSIS — Z862 Personal history of diseases of the blood and blood-forming organs and certain disorders involving the immune mechanism: Secondary | ICD-10-CM | POA: Insufficient documentation

## 2013-08-23 MED ORDER — TRAMADOL HCL 50 MG PO TABS: 50.0000 mg | ORAL_TABLET | Freq: Four times a day (QID) | ORAL | Status: DC | PRN

## 2013-08-23 NOTE — ED Notes (Signed)
Pt reports injury to right ankle 2 weeks ago, was seen in ED and continues to have pain.

## 2013-08-23 NOTE — ED Provider Notes (Signed)
CSN: [id number]     Arrival date & time 08/23/13  1121 History   First MD Initiated Contact with Patient 08/23/13 1148     Chief Complaint  Patient presents with  . Ankle Pain   (Consider location/radiation/quality/duration/timing/severity/associated sxs/prior Treatment) Patient is a 42 y.o. female presenting with ankle pain.  Ankle Pain  Pt reports seen about 2 weeks ago for ankle injury, treated for ankle sprain but still having moderate aching pain worse with walking, no new injury.   Past Medical History  Diagnosis Date  . Anemia   . Bowel obstruction 2012  . Depression    Past Surgical History  Procedure Laterality Date  . Abdominal hysterectomy    . Small intestine surgery  2012   Family History  Problem Relation Age of Onset  . Diabetes Other   . Hypertension Other    History  Substance Use Topics  . Smoking status: Never Smoker   . Smokeless tobacco: Never Used  . Alcohol Use: No   OB History   Grav Para Term Preterm Abortions TAB SAB Ect Mult Living                 Review of Systems All other systems reviewed and are negative except as noted in HPI.   Allergies  Review of patient's allergies indicates no known allergies.  Home Medications   Current Outpatient Rx  Name  Route  Sig  Dispense  Refill  . ibuprofen (ADVIL,MOTRIN) 800 MG tablet   Oral   Take 1 tablet (800 mg total) by mouth 3 (three) times daily.   21 tablet   0   . sertraline (ZOLOFT) 50 MG tablet   Oral   Take 50 mg by mouth every morning.          BP 122/88  Pulse 54  Resp 18  Ht 5\' 6"  (1.676 m)  Wt 160 lb (72.576 kg)  BMI 25.84 kg/m2  SpO2 98% Physical Exam  Constitutional: She is oriented to person, place, and time. She appears well-developed and well-nourished.  HENT:  Head: Normocephalic and atraumatic.  Neck: Neck supple.  Pulmonary/Chest: Effort normal.  Musculoskeletal: Normal range of motion. She exhibits tenderness (R lateral maleolus). She exhibits no edema.   Neurological: She is alert and oriented to person, place, and time. No cranial nerve deficit.  Psychiatric: She has a normal mood and affect. Her behavior is normal.    ED Course  Procedures (including critical care time) Labs Review Labs Reviewed - No data to display Imaging Review Dg Ankle Complete Right  08/23/2013   CLINICAL DATA:  Ankle pain. Recent lateral malleolar avulsion fracture.  EXAM: RIGHT ANKLE - COMPLETE 3+ VIEW  COMPARISON:  08/11/2013  FINDINGS: Again demonstrated a small avulsion fracture inferior to the tip of the lateral malleolus. The ankle mortise intact. The talar dome is normal. No joint effusion. Calcaneus is normal.  IMPRESSION: No significant change and avulsion fracture of the lateral malleolus.   Electronically Signed   By: Genevive Bi M.D.   On: 08/23/2013 12:56    EKG Interpretation   None       MDM   1. Closed avulsion fracture of distal end of right fibula     Xray again demonstrates avulsion fracture of malleolus. Cam walker and referral to Ortho for long term followup.     Paublo Warshawsky B. Bernette Mayers, MD 08/23/13 1314

## 2014-06-12 ENCOUNTER — Encounter (HOSPITAL_COMMUNITY): Payer: Self-pay | Admitting: Emergency Medicine

## 2014-06-12 ENCOUNTER — Emergency Department (HOSPITAL_COMMUNITY)
Admission: EM | Admit: 2014-06-12 | Discharge: 2014-06-12 | Disposition: A | Discharge status: Home / Self Care | Payer: Medicaid Other | Attending: Emergency Medicine | Admitting: Emergency Medicine

## 2014-06-12 DIAGNOSIS — K644 Residual hemorrhoidal skin tags: Secondary | ICD-10-CM | POA: Insufficient documentation

## 2014-06-12 DIAGNOSIS — Z8659 Personal history of other mental and behavioral disorders: Secondary | ICD-10-CM | POA: Diagnosis not present

## 2014-06-12 DIAGNOSIS — Z8719 Personal history of other diseases of the digestive system: Secondary | ICD-10-CM | POA: Insufficient documentation

## 2014-06-12 DIAGNOSIS — K6289 Other specified diseases of anus and rectum: Secondary | ICD-10-CM | POA: Insufficient documentation

## 2014-06-12 DIAGNOSIS — IMO0002 Reserved for concepts with insufficient information to code with codable children: Secondary | ICD-10-CM | POA: Diagnosis not present

## 2014-06-12 DIAGNOSIS — K645 Perianal venous thrombosis: Secondary | ICD-10-CM

## 2014-06-12 DIAGNOSIS — Z862 Personal history of diseases of the blood and blood-forming organs and certain disorders involving the immune mechanism: Secondary | ICD-10-CM | POA: Insufficient documentation

## 2014-06-12 MED ORDER — HYDROCORTISONE ACETATE 25 MG RE SUPP: 25.0000 mg | Freq: Two times a day (BID) | RECTAL | Status: DC

## 2014-06-12 MED ORDER — LIDOCAINE HCL 2 % EX GEL
1.0000 "application " | Freq: Once | CUTANEOUS | Status: AC
  Administered 2014-06-12: 1
  Filled 2014-06-12: qty 10

## 2014-06-12 MED ORDER — NAPROXEN 375 MG PO TABS: 375.0000 mg | ORAL_TABLET | Freq: Two times a day (BID) | ORAL | Status: DC

## 2014-06-12 NOTE — ED Notes (Signed)
She states she has a painful external hemorrhoid; plus constipation x 5 days.  She is in no distress.

## 2014-06-12 NOTE — ED Provider Notes (Signed)
CSN: [id number]     Arrival date & time 06/12/14  1154 History   First MD Initiated Contact with Patient 06/12/14 1223     Chief Complaint  Patient presents with  . Rectal Pain     (Consider location/radiation/quality/duration/timing/severity/associated sxs/prior Treatment) HPI Comments: 43 year old female with history of bowel obstruction, anemia, hysterectomy  rectal pain and constipation for the past 5 days. Patient denies history of hemorrhoids. Mild worse with bowel movements. No fevers abdominal pain or vomiting. Patient is passing gas.   The history is provided by the patient.    Past Medical History  Diagnosis Date  . Anemia   . Bowel obstruction 2012  . Depression    Past Surgical History  Procedure Laterality Date  . Abdominal hysterectomy    . Small intestine surgery  2012   Family History  Problem Relation Age of Onset  . Diabetes Other   . Hypertension Other    History  Substance Use Topics  . Smoking status: Never Smoker   . Smokeless tobacco: Never Used  . Alcohol Use: No   OB History   Grav Para Term Preterm Abortions TAB SAB Ect Mult Living                 Review of Systems  Constitutional: Negative for fever and chills.  HENT: Negative for congestion.   Eyes: Negative for visual disturbance.  Respiratory: Negative for shortness of breath.   Cardiovascular: Negative for chest pain.  Gastrointestinal: Negative for vomiting and abdominal pain.  Genitourinary: Negative for dysuria and flank pain.  Musculoskeletal: Negative for back pain, neck pain and neck stiffness.  Skin: Negative for rash.  Neurological: Negative for light-headedness and headaches.      Allergies  Review of patient's allergies indicates no known allergies.  Home Medications   Prior to Admission medications   Medication Sig Start Date End Date Taking? Authorizing Provider  HYDROcodone-acetaminophen (NORCO) 7.5-325 MG per tablet Take 1 tablet by mouth every 6 (six) hours  as needed for moderate pain.   Yes Historical Provider, MD  hydrocortisone (ANUSOL-HC) 2.5 % rectal cream Place 1 application rectally 4 (four) times daily.   Yes Historical Provider, MD  hydrocortisone (ANUSOL-HC) 25 MG suppository Place 1 suppository (25 mg total) rectally 2 (two) times daily. For 7 days 06/12/14   Enid SkeensJoshua M Karas Pickerill, MD  naproxen (NAPROSYN) 375 MG tablet Take 1 tablet (375 mg total) by mouth 2 (two) times daily. 06/12/14   Enid SkeensJoshua M Conya Ellinwood, MD   BP 130/74  Pulse 80  Temp(Src) 98 F (36.7 C)  Resp 16  SpO2 100% Physical Exam  Nursing note and vitals reviewed. Constitutional: She is oriented to person, place, and time. She appears well-developed and well-nourished.  HENT:  Head: Normocephalic and atraumatic.  Eyes: Conjunctivae are normal. Right eye exhibits no discharge. Left eye exhibits no discharge.  Neck: Normal range of motion. Neck supple. No tracheal deviation present.  Cardiovascular: Normal rate.   Pulmonary/Chest: Effort normal.  Abdominal: Soft. She exhibits no distension. There is no tenderness. There is no guarding.  Genitourinary:  Patient has 1 cm thrombosed hemorrhoid, tender to palpation, no signs of infection.  Musculoskeletal: She exhibits no edema.  Neurological: She is alert and oriented to person, place, and time.  Skin: Skin is warm. No rash noted.  Psychiatric: She has a normal mood and affect.    ED Course  Procedures (including critical care time) Labs Review Labs Reviewed - No data to display  Imaging  Review No results found.   EKG Interpretation None      MDM   Final diagnoses:  External hemorrhoid, thrombosed   Painful thrombosed hemorrhoid on exam. Discussed supportive care and followup outpatient. Lidocaine gel in the ER and Anusol with sitz baths outpatient  Results and differential diagnosis were discussed with the patient/parent/guardian. Close follow up outpatient was discussed, comfortable with the plan.   Medications   lidocaine (XYLOCAINE) 2 % jelly 1 application (1 application Other Given 06/12/14 1305)    Filed Vitals:   06/12/14 1219  BP: 130/74  Pulse: 80  Temp: 98 F (36.7 C)  Resp: 16  SpO2: 100%          Enid Skeens, MD 06/12/14 1322

## 2014-06-12 NOTE — ED Notes (Signed)
Educated patient on how to administer lidocaine jelly per rectum utilizing teach back method no questions at this time.

## 2014-06-12 NOTE — Discharge Instructions (Signed)
Use topical lidocaine once daily as needed. Soak in sitz bath twice daily. Use suppositories as directed. Followup with local Dr. for recheck.  If you were given medicines take as directed.  If you are on coumadin or contraceptives realize their levels and effectiveness is altered by many different medicines.  If you have any reaction (rash, tongues swelling, other) to the medicines stop taking and see a physician.   Please follow up as directed and return to the ER or see a physician for new or worsening symptoms.  Thank you. Filed Vitals:   06/12/14 1219  BP: 130/74  Pulse: 80  Temp: 98 F (36.7 C)  Resp: 16  SpO2: 100%

## 2015-08-30 ENCOUNTER — Emergency Department (HOSPITAL_COMMUNITY): Payer: Medicaid Other

## 2015-08-30 ENCOUNTER — Emergency Department (HOSPITAL_COMMUNITY)
Admission: EM | Admit: 2015-08-30 | Discharge: 2015-08-30 | Disposition: A | Discharge status: Home / Self Care | Payer: Medicaid Other | Attending: Emergency Medicine | Admitting: Emergency Medicine

## 2015-08-30 DIAGNOSIS — S82842A Displaced bimalleolar fracture of left lower leg, initial encounter for closed fracture: Secondary | ICD-10-CM | POA: Diagnosis not present

## 2015-08-30 DIAGNOSIS — Z8719 Personal history of other diseases of the digestive system: Secondary | ICD-10-CM | POA: Diagnosis not present

## 2015-08-30 DIAGNOSIS — R52 Pain, unspecified: Secondary | ICD-10-CM

## 2015-08-30 DIAGNOSIS — M25572 Pain in left ankle and joints of left foot: Secondary | ICD-10-CM | POA: Diagnosis present

## 2015-08-30 DIAGNOSIS — Z8659 Personal history of other mental and behavioral disorders: Secondary | ICD-10-CM | POA: Diagnosis not present

## 2015-08-30 DIAGNOSIS — Y9389 Activity, other specified: Secondary | ICD-10-CM | POA: Diagnosis not present

## 2015-08-30 DIAGNOSIS — W010XXA Fall on same level from slipping, tripping and stumbling without subsequent striking against object, initial encounter: Secondary | ICD-10-CM | POA: Insufficient documentation

## 2015-08-30 DIAGNOSIS — Y998 Other external cause status: Secondary | ICD-10-CM | POA: Diagnosis not present

## 2015-08-30 DIAGNOSIS — Z862 Personal history of diseases of the blood and blood-forming organs and certain disorders involving the immune mechanism: Secondary | ICD-10-CM | POA: Insufficient documentation

## 2015-08-30 DIAGNOSIS — Y9289 Other specified places as the place of occurrence of the external cause: Secondary | ICD-10-CM | POA: Diagnosis not present

## 2015-08-30 DIAGNOSIS — S9305XA Dislocation of left ankle joint, initial encounter: Secondary | ICD-10-CM | POA: Diagnosis not present

## 2015-08-30 MED ORDER — IBUPROFEN 800 MG PO TABS: 800.0000 mg | ORAL_TABLET | Freq: Three times a day (TID) | ORAL | Status: DC

## 2015-08-30 MED ORDER — ONDANSETRON 4 MG PO TBDP: 4.0000 mg | ORAL_TABLET | Freq: Three times a day (TID) | ORAL | Status: DC | PRN

## 2015-08-30 MED ORDER — FENTANYL CITRATE (PF) 100 MCG/2ML IJ SOLN: 100.0000 ug | Freq: Once | INTRAMUSCULAR | Status: AC

## 2015-08-30 MED ORDER — FLUMAZENIL 0.5 MG/5ML IV SOLN
INTRAVENOUS | Status: AC
  Filled 2015-08-30: qty 5

## 2015-08-30 MED ORDER — OXYCODONE-ACETAMINOPHEN 5-325 MG PO TABS
2.0000 | ORAL_TABLET | Freq: Once | ORAL | Status: AC
  Administered 2015-08-30: 2 via ORAL
  Filled 2015-08-30: qty 2

## 2015-08-30 MED ORDER — MIDAZOLAM HCL 2 MG/2ML IJ SOLN
INTRAMUSCULAR | Status: AC
  Filled 2015-08-30: qty 4

## 2015-08-30 MED ORDER — MIDAZOLAM HCL 2 MG/2ML IJ SOLN: 4.0000 mg | Freq: Once | INTRAMUSCULAR | Status: AC

## 2015-08-30 MED ORDER — FENTANYL CITRATE (PF) 100 MCG/2ML IJ SOLN
INTRAMUSCULAR | Status: AC
  Filled 2015-08-30: qty 2

## 2015-08-30 MED ORDER — OXYCODONE-ACETAMINOPHEN 5-325 MG PO TABS: 1.0000 | ORAL_TABLET | ORAL | Status: DC | PRN

## 2015-08-30 MED ORDER — MIDAZOLAM HCL 2 MG/2ML IJ SOLN
INTRAMUSCULAR | Status: AC | PRN
  Administered 2015-08-30: 2 mg via INTRAVENOUS

## 2015-08-30 NOTE — ED Provider Notes (Signed)
  Physical Exam  BP 126/86 mmHg  Pulse 89  Temp(Src) 97.4 F (36.3 C) (Oral)  Resp 17  Ht 5\' 6"  (1.676 m)  Wt 166 lb (75.297 kg)  BMI 26.81 kg/m2  SpO2 100%  Physical Exam   ED Course  Reduction of dislocation Date/Time: 08/30/2015 11:04 AM Performed by: Roxy Horseman Authorized by: Roxy Horseman Consent: The procedure was performed in an emergent situation. Verbal consent obtained. Written consent obtained. Risks and benefits: risks, benefits and alternatives were discussed Consent given by: patient Patient understanding: patient states understanding of the procedure being performed Patient consent: the patient's understanding of the procedure matches consent given Procedure consent: procedure consent matches procedure scheduled Relevant documents: relevant documents present and verified Test results: test results available and properly labeled Site marked: the operative site was marked Imaging studies: imaging studies available Required items: required blood products, implants, devices, and special equipment available Patient identity confirmed: verbally with patient and arm band Time out: Immediately prior to procedure a "time out" was called to verify the correct patient, procedure, equipment, support staff and site/side marked as required. Preparation: Patient was prepped and draped in the usual sterile fashion. Local anesthesia used: no Patient sedated: see note by Dr. Hyacinth Meeker. Patient tolerance: Patient tolerated the procedure well with no immediate complications Comments: Tolerated reduction well.  Strong pulses after reduction.  Brisk cap refill.  Splint applied by ortho tech.         Roxy Horseman, PA-C 08/30/15 1107  Eber Hong, MD 08/30/15 [phone]

## 2015-08-30 NOTE — ED Notes (Signed)
BIB EMS. Per EMS Pt slipped and twisted left ankle. Denies head, neck, and back pain. Stabilized with towels, could not tolerate a splint. EMS gave fentanyl.   Obvious left ankle deformity. Pedal pulses strong. Pain 5/10 after Fentanyl.

## 2015-08-30 NOTE — ED Provider Notes (Signed)
CSN: [id number]     Arrival date & time 08/30/15  1018 History   First MD Initiated Contact with Patient 08/30/15 1018     Chief Complaint  Patient presents with  . Ankle Pain     (Consider location/radiation/quality/duration/timing/severity/associated sxs/prior Treatment) HPI Comments: Ankle injury after pt states that she slipped and fell just pta Acute pain Severe Constant Can't walk because of deformity No other injuries. No numbness / weakness  Patient is a 44 y.o. female presenting with ankle pain. The history is provided by the patient and the EMS personnel.  Ankle Pain   Past Medical History  Diagnosis Date  . Anemia   . Bowel obstruction 2012  . Depression    Past Surgical History  Procedure Laterality Date  . Abdominal hysterectomy    . Small intestine surgery  2012   Family History  Problem Relation Age of Onset  . Diabetes Other   . Hypertension Other    Social History  Substance Use Topics  . Smoking status: Never Smoker   . Smokeless tobacco: Never Used  . Alcohol Use: No   OB History    No data available     Review of Systems  All other systems reviewed and are negative.     Allergies  Review of patient's allergies indicates no known allergies.  Home Medications   Prior to Admission medications   Medication Sig Start Date End Date Taking? Authorizing Provider  tiZANidine (ZANAFLEX) 4 MG tablet Take 8 mg by mouth 3 (three) times daily as needed for muscle spasms.  08/18/15  Yes Historical Provider, MD  ibuprofen (ADVIL,MOTRIN) 800 MG tablet Take 1 tablet (800 mg total) by mouth 3 (three) times daily. 08/30/15   Eber Hong, MD  ondansetron (ZOFRAN ODT) 4 MG disintegrating tablet Take 1 tablet (4 mg total) by mouth every 8 (eight) hours as needed for nausea. 08/30/15   Eber Hong, MD  oxyCODONE-acetaminophen (PERCOCET) 5-325 MG tablet Take 1 tablet by mouth every 4 (four) hours as needed. 08/30/15   Eber Hong, MD   BP 122/80 mmHg   Pulse 82  Temp(Src) 97.9 F (36.6 C) (Oral)  Resp 20  Ht  (1.676 m)  Wt 166 lb (75.297 kg)  BMI 26.81 kg/m2  SpO2 100% Physical Exam  Constitutional: She appears well-developed and well-nourished. No distress.  HENT:  Head: Normocephalic and atraumatic.  Mouth/Throat: Oropharynx is clear and moist. No oropharyngeal exudate.  Eyes: Conjunctivae and EOM are normal. Pupils are equal, round, and reactive to light. Right eye exhibits no discharge. Left eye exhibits no discharge. No scleral icterus.  Neck: Normal range of motion. Neck supple. No JVD present. No thyromegaly present.  Cardiovascular: Normal rate, regular rhythm, normal heart sounds and intact distal pulses.  Exam reveals no gallop and no friction rub.   No murmur heard. Pulmonary/Chest: Effort normal and breath sounds normal. No respiratory distress. She has no wheezes. She has no rales.  Abdominal: Soft. Bowel sounds are normal. She exhibits no distension and no mass. There is no tenderness.  Musculoskeletal: She exhibits tenderness. She exhibits no edema.  Dec ROM of the L ankle secondary to injury / deformity / pain.  Pulses palpated at the L ankle.  Lymphadenopathy:    She has no cervical adenopathy.  Neurological: She is alert. Coordination normal.  Skin: Skin is warm and dry. No rash noted. No erythema.  Psychiatric: She has a normal mood and affect. Her behavior is normal.  Nursing note and  vitals reviewed.   ED Course  Procedures (including critical care time) Labs Review Labs Reviewed - No data to display  Imaging Review Dg Ankle 2 Views Left  08/30/2015  CLINICAL DATA:  Distal left tibial and fibular fractures. Status post reduction. EXAM: LEFT ANKLE - 2 VIEW COMPARISON:  Same day. FINDINGS: The left ankle has been casted and immobilized. There has been reduction of the talar dislocation noted on the prior exam, as well as significantly improved alignment of distal fibular and tibial fractures compared to  prior exam. IMPRESSION: Casting and immobilization of left ankle after reduction of talar dislocation and distal fibular and tibial fractures. Electronically Signed   By: Lupita Raider, M.D.   On: 08/30/2015 11:52   Dg Ankle 2 Views Left  08/30/2015  CLINICAL DATA:  Acute left ankle pain and deformity after fall today. Initial encounter. EXAM: LEFT ANKLE - 2 VIEW COMPARISON:  None. FINDINGS: Severely displaced oblique fracture is seen involving the distal fibula, as well as severely displaced medial malleolar fracture. There is severe lateral dislocation of the talus relative to the tibia. These findings appear to be closed and posttraumatic. IMPRESSION: Severely displaced medial malleolar and distal fibular fractures are noted, with severe lateral dislocation of the talus relative to distal tibia. Electronically Signed   By: Lupita Raider, M.D.   On: 08/30/2015 10:57   I have personally reviewed and evaluated these images and lab results as part of my medical decision-making.   MDM   Final diagnoses:  Ankle dislocation, left, initial encounter  Ankle fracture, bimalleolar, closed, left, initial encounter    Obvious L ankle dislocation +/- fracture  Stat xray Pain meds given prehospital Will need sedation - d/w pt who is in agreement.  Procedural sedation performed using fentanyl and Versed, patient was adequately sedated, see note below. Please see attached note from Mr. Roxy Horseman regarding ankle reduction. Post reduction films ordered  D/w Dr. Charlann Boxer - recommends f/u with Dr. Victorino Dike on Monday - pt informed and in agreement.  I have personally viewed and interpreted the imaging and agree with radiologist interpretation.   Procedural sedation Performed by: Vida Roller Consent: Verbal consent obtained. Risks and benefits: risks, benefits and alternatives were discussed Required items: required blood products, implants, devices, and special equipment available Patient  identity confirmed: arm band and provided demographic data Time out: Immediately prior to procedure a "time out" was called to verify the correct patient, procedure, equipment, support staff and site/side marked as required.  Sedation type: moderate (conscious) sedation NPO time confirmed and considedered  Sedatives: Versed / Fentantyl  Physician Time at Bedside: 15 minutes  Vitals: Vital signs were monitored during sedation. Cardiac Monitor, pulse oximeter Patient tolerance: Patient tolerated the procedure well with no immediate complications. Comments: Pt with uneventful recovered. Returned to pre-procedural sedation baseline  Meds given in ED:  Medications  fentaNYL (SUBLIMAZE) injection 100 mcg (0 mcg Intravenous Duplicate 08/30/15 1114)  midazolam (VERSED) injection 4 mg (0 mg Intravenous Duplicate 08/30/15 1114)  midazolam (VERSED) injection (2 mg Intravenous Given 08/30/15 1055)  oxyCODONE-acetaminophen (PERCOCET/ROXICET) 5-325 MG per tablet 2 tablet (2 tablets Oral Given 08/30/15 1310)    New Prescriptions   IBUPROFEN (ADVIL,MOTRIN) 800 MG TABLET    Take 1 tablet (800 mg total) by mouth 3 (three) times daily.   ONDANSETRON (ZOFRAN ODT) 4 MG DISINTEGRATING TABLET    Take 1 tablet (4 mg total) by mouth every 8 (eight) hours as needed for nausea.   OXYCODONE-ACETAMINOPHEN (  PERCOCET) 5-325 MG TABLET    Take 1 tablet by mouth every 4 (four) hours as needed.      Eber Hong, MD 08/30/15 [phone]

## 2015-08-30 NOTE — ED Notes (Signed)
Bed: XB28 Expected date:  Expected time:  Means of arrival:  Comments: Ems- ankle injury

## 2015-08-30 NOTE — Discharge Instructions (Signed)
The orthopedist thinks that this will likely need surgery - please do not walk on your foot.  You should use crutches, ICE, ELEVATE, and use the pain medicines as prescribed.  Call the office in the next 24 hours - they will see you on Monday with Dr. Victorino Dike.  Please obtain all of your results from medical records or have your doctors office obtain the results - share them with your doctor - you should be seen at your doctors office in the next 2 days. Call today to arrange your follow up. Take the medications as prescribed. Please review all of the medicines and only take them if you do not have an allergy to them. Please be aware that if you are taking birth control pills, taking other prescriptions, ESPECIALLY ANTIBIOTICS may make the birth control ineffective - if this is the case, either do not engage in sexual activity or use alternative methods of birth control such as condoms until you have finished the medicine and your family doctor says it is OK to restart them. If you are on a blood thinner such as COUMADIN, be aware that any other medicine that you take may cause the coumadin to either work too much, or not enough - you should have your coumadin level rechecked in next 7 days if this is the case.  ?  It is also a possibility that you have an allergic reaction to any of the medicines that you have been prescribed - Everybody reacts differently to medications and while MOST people have no trouble with most medicines, you may have a reaction such as nausea, vomiting, rash, swelling, shortness of breath. If this is the case, please stop taking the medicine immediately and contact your physician.  ?  You should return to the ER if you develop severe or worsening symptoms.

## 2015-09-01 ENCOUNTER — Other Ambulatory Visit: Payer: Self-pay | Admitting: Orthopedic Surgery

## 2015-09-01 ENCOUNTER — Encounter (HOSPITAL_COMMUNITY): Payer: Self-pay | Admitting: *Deleted

## 2015-09-01 NOTE — Progress Notes (Signed)
Pt denies cardiac history, chest pain or sob.  Instructed pt to stop Ibuprofen as of today.

## 2015-09-02 ENCOUNTER — Emergency Department (HOSPITAL_COMMUNITY)
Admission: EM | Admit: 2015-09-02 | Discharge: 2015-09-03 | Disposition: A | Discharge status: Home / Self Care | Payer: Medicaid Other | Source: Home / Self Care | Attending: Emergency Medicine | Admitting: Emergency Medicine

## 2015-09-02 ENCOUNTER — Emergency Department (HOSPITAL_COMMUNITY): Payer: Medicaid Other

## 2015-09-02 ENCOUNTER — Encounter (HOSPITAL_COMMUNITY): Payer: Self-pay | Admitting: Nurse Practitioner

## 2015-09-02 DIAGNOSIS — S53105A Unspecified dislocation of left ulnohumeral joint, initial encounter: Secondary | ICD-10-CM

## 2015-09-02 MED ORDER — PROPOFOL 10 MG/ML IV BOLUS
100.0000 mg | Freq: Once | INTRAVENOUS | Status: AC
  Administered 2015-09-02: 100 mg via INTRAVENOUS
  Filled 2015-09-02: qty 1

## 2015-09-02 MED ORDER — ONDANSETRON HCL 4 MG/2ML IJ SOLN
4.0000 mg | Freq: Once | INTRAMUSCULAR | Status: AC
  Administered 2015-09-02: 4 mg via INTRAVENOUS
  Filled 2015-09-02: qty 2

## 2015-09-02 MED ORDER — OXYCODONE-ACETAMINOPHEN 5-325 MG PO TABS: 2.0000 | ORAL_TABLET | ORAL | Status: DC | PRN

## 2015-09-02 MED ORDER — FENTANYL CITRATE (PF) 100 MCG/2ML IJ SOLN
100.0000 ug | INTRAMUSCULAR | Status: DC | PRN
  Administered 2015-09-02: 100 ug via INTRAVENOUS
  Filled 2015-09-02: qty 2

## 2015-09-02 MED ORDER — PROPOFOL 10 MG/ML IV BOLUS: 100.0000 mg/kg | Freq: Once | INTRAVENOUS | Status: DC

## 2015-09-02 NOTE — ED Provider Notes (Signed)
CSN: [id number]     Arrival date & time 09/02/15  2126 History   First MD Initiated Contact with Patient 09/02/15 2129     Chief Complaint  Patient presents with  . Elbow Deformity     Left      HPI  Impression presents for evaluation after left elbow injury. She injured her ankle several days ago. She was on her crutches sign fell and landed on outstretched left hand complete the pain at the elbow.  Did not struck her head. No neck or back pain. No new injury to her lower leg.  Past Medical History  Diagnosis Date  . Anemia   . Bowel obstruction (HCC) 2012  . Depression   . History of blood transfusion    Past Surgical History  Procedure Laterality Date  . Abdominal hysterectomy    . Small intestine surgery  2012  . Laceration repair Right     arm with artery involvement  . Tubal ligation    . Wisdom tooth extraction    . Vaginal delivery      x3, epidural anesth. with 2 of them  . Orif ankle fracture Left 09/04/2015    Procedure: OPEN REDUCTION INTERNAL FIXATION (ORIF) LEFT BIOMALLEOLAR FRACTURE;  Surgeon: Toni Arthurs, MD;  Location: [facility] OR;  Service: Orthopedics;  Laterality: Left;   Family History  Problem Relation Age of Onset  . Diabetes Other   . Hypertension Other   . Diabetes Father    Social History  Substance Use Topics  . Smoking status: Never Smoker   . Smokeless tobacco: Never Used  . Alcohol Use: No   OB History    No data available     Review of Systems  Constitutional: Negative for fever, chills, diaphoresis, appetite change and fatigue.  HENT: Negative for mouth sores, sore throat and trouble swallowing.   Eyes: Negative for visual disturbance.  Respiratory: Negative for cough, chest tightness, shortness of breath and wheezing.   Cardiovascular: Negative for chest pain.  Gastrointestinal: Negative for nausea, vomiting, abdominal pain, diarrhea and abdominal distention.  Endocrine: Negative for polydipsia, polyphagia and polyuria.   Genitourinary: Negative for dysuria, frequency and hematuria.  Musculoskeletal: Negative for gait problem.       Left elbow pain and deformity.  Skin: Negative for color change, pallor and rash.  Neurological: Negative for dizziness, syncope, light-headedness and headaches.  Hematological: Does not bruise/bleed easily.  Psychiatric/Behavioral: Negative for behavioral problems and confusion.      Allergies  Review of patient's allergies indicates no known allergies.  Home Medications   Prior to Admission medications   Medication Sig Start Date End Date Taking? Authorizing Provider  ibuprofen (ADVIL,MOTRIN) 800 MG tablet Take 1 tablet (800 mg total) by mouth 3 (three) times daily. 08/30/15  Yes Eber Hong, MD  ondansetron (ZOFRAN ODT) 4 MG disintegrating tablet Take 1 tablet (4 mg total) by mouth every 8 (eight) hours as needed for nausea. 08/30/15  Yes Eber Hong, MD  tiZANidine (ZANAFLEX) 4 MG tablet Take 8 mg by mouth 3 (three) times daily as needed for muscle spasms.  08/18/15  Yes Historical Provider, MD  acetaminophen (TYLENOL) 325 MG tablet Take 2 tablets (650 mg total) by mouth every 6 (six) hours as needed for mild pain (or Fever >/= 101). 09/06/15   Toni Arthurs, MD  docusate sodium (COLACE) 100 MG capsule Take 1 capsule (100 mg total) by mouth 2 (two) times daily. 09/06/15   Toni Arthurs, MD  enoxaparin (LOVENOX) 40  MG/0.4ML injection Inject 0.4 mLs (40 mg total) into the skin daily. 09/06/15   Toni Arthurs, MD  oxyCODONE (OXY IR/ROXICODONE) 5 MG immediate release tablet Take 1-2 tablets (5-10 mg total) by mouth every 3 (three) hours as needed for breakthrough pain. 09/06/15   Toni Arthurs, MD  oxyCODONE-acetaminophen (PERCOCET/ROXICET) 5-325 MG tablet Take 2 tablets by mouth every 4 (four) hours as needed. 09/02/15   Rolland Porter, MD  senna (SENOKOT) 8.6 MG TABS tablet Take 1 tablet (8.6 mg total) by mouth 2 (two) times daily. 09/06/15   Toni Arthurs, MD   BP 126/89 mmHg  Pulse 91   Temp(Src) 98.2 F (36.8 C) (Oral)  Resp 24  SpO2 99% Physical Exam  Constitutional: She is oriented to person, place, and time. She appears well-developed and well-nourished. No distress.  HENT:  Head: Normocephalic.  Eyes: Conjunctivae are normal. Pupils are equal, round, and reactive to light. No scleral icterus.  Neck: Normal range of motion. Neck supple. No thyromegaly present.  Cardiovascular: Normal rate and regular rhythm.  Exam reveals no gallop and no friction rub.   No murmur heard. Pulmonary/Chest: Effort normal and breath sounds normal. No respiratory distress. She has no wheezes. She has no rales.  Abdominal: Soft. Bowel sounds are normal. She exhibits no distension. There is no tenderness. There is no rebound.  Musculoskeletal: Normal range of motion.  Possible posterior dislocation of the left elbow. Prominent olecranon. Normal sensation distally. Normal cap refill and pulses. Left lower extremity is in a coaptation splint. Normal sensation distally. No areas of new injury or pain per her report.  Neurological: She is alert and oriented to person, place, and time.  Skin: Skin is warm and dry. No rash noted.  Psychiatric: She has a normal mood and affect. Her behavior is normal.    ED Course  Procedures (including critical care time) Labs Review Labs Reviewed - No data to display  Imaging Review No results found. I have personally reviewed and evaluated these images and lab results as part of my medical decision-making.   EKG Interpretation None      MDM   Final diagnoses:  Elbow dislocation, left, initial encounter    Procedural sedation Performed by: Claudean Kinds Consent: Verbal consent obtained. Risks and benefits: risks, benefits and alternatives were discussed Required items: required blood products, implants, devices, and special equipment available Patient identity confirmed: arm band and provided demographic data Time out: Immediately prior  to procedure a "time out" was called to verify the correct patient, procedure, equipment, support staff and site/side marked as required.  Sedation type: moderate (conscious) sedation NPO time confirmed and considedered  Sedatives: PROPOFOL  Physician Time at Bedside: 20 Minutes   Reduction of dislocation Date/Time: 11:03 PM Performed by: Claudean Kinds Authorized by: Claudean Kinds Consent: Verbal consent obtained. Risks and benefits: risks, benefits and alternatives were discussed Consent given by: patient Required items: required blood products, implants, devices, and special equipment available Time out: Immediately prior to procedure a "time out" was called to verify the correct patient, procedure, equipment, support staff and site/side marked as required.  Patient sedated: Propofol--see note  Vitals: Vital signs were monitored during sedation. Patient tolerance: Patient tolerated the procedure well with no immediate complications. Joint: Lt elbow Reduction technique: Sedation, extention/supination, axial traction, flexion.      Vitals: Vital signs were monitored during sedation. Cardiac Monitor, pulse oximeter Patient tolerance: Patient tolerated the procedure well with no immediate complications. Comments: Pt with uneventful recovered.  Returned to pre-procedural sedation baseline    Relocated without difficulty. Post procedure x-ray pending. She is awake from her sedation. Plan will be home, wheelchair, continue with her follow-up.   Rolland Porter, MD 09/06/15 2303

## 2015-09-02 NOTE — Discharge Instructions (Signed)
Elbow Dislocation °Elbow dislocation is the displacement of the bones that form the elbow joint. Three bones come together to form the elbow. The humerus is the bone in the upper arm. The radius and ulna are the 2 bones in the forearm that form the lower part of the elbow. The elbow is held in place by very strong, fibrous tissues (ligaments) that connect the bones to each other. °CAUSES °Elbow dislocations are not common. Typically, they occur when a person falls forward with hands and elbows outstretched. The force of the impact is sent to the elbow. Usually, there is a twisting motion in this force. Elbow dislocations also happen during car crashes when passengers reach out to brace themselves during the impact. °RISK FACTORS °Although dislocation of the elbow can happen to anyone, some people are at greater risk than others. People at increased risk of elbow dislocation include: °· People born with greater looseness in their ligaments. °· People born with an ulna bone that has a shallow groove for the elbow hinge joint. °SYMPTOMS °Symptoms of a complete elbow dislocation usually are obvious. They include extreme pain and the appearance of a deformed arm.  °Symptoms of a partial dislocation may not be obvious. Your elbow may move somewhat, but you may have pain and swelling. Also, there will likely be bruising on the inside and outside of your elbow where ligaments have been stretched or torn.  °DIAGNOSIS  °To diagnose elbow dislocation, your caregiver will perform a physical exam. During this exam, your caregiver will check your arm for tenderness, swelling, and deformity. The skin around your arm and the circulation in your arm also will be checked. Your pulse will be checked at your wrist. If your artery is injured during dislocation, your hand will be cool to the touch and may be white or purple in color. Your caregiver also may check your arm and your ability to move your wrist and fingers to see if you had  any damage to your nerves during dislocation. °An X-ray exam also may be done to determine if there is bone injury. Results of an X-ray exam can help show the direction of the dislocation. °If you have a simple dislocation, there is no major bone injury. If you have a complex dislocation, you may have broken bones (fractures) associated with the ligament injuries. °TREATMENT °For a simple elbow dislocation, your bones can usually be realigned in a procedure called a reduction. This is a treatment in which your bones are manually moved back into place either with the use of numbing medicine (regional anesthetic) around your elbow or medicine to make you sleep (general anesthetic). Then your elbow is kept immobile with a sling or a splint for 2 to 3 weeks. This is followed with physical therapy to help your joint move again. °Complex elbow dislocation may require surgery to restore joint alignment and repair ligaments. After surgery, your elbow may be protected with an external hinge. This device keeps your elbow from dislocating again while motion exercises are done. Additional surgery may be needed to repair any injuries to blood vessels and nerves or bones and ligaments or to relieve pressure from excessive swelling around the muscles. °HOME CARE INSTRUCTIONS °The following measures can help to reduce pain and hasten the healing process: °· Rest your injured joint. Do not move it. Avoid activities similar to the one that caused your injury. °· Exercise your hand and fingers as instructed by your caregiver. °· Apply ice to your injured joint   for 1 to 2 days after your reduction or as directed by your caregiver. Applying ice helps to reduce inflammation and pain. °¨ Put ice in a plastic bag. °¨ Place a towel between your skin and the bag. °¨ Leave the ice on for 15 to 20 minutes at a time, every couple of hours while you are awake. °· Elevate your arm above your heart and move your wrist and fingers as instructed by  your caregiver to help limit swelling. °· Take over-the-counter or prescription medicines for pain as directed by your caregiver. °SEEK IMMEDIATE MEDICAL CARE IF: °· Your splint becomes damaged. °· You have an external hinge and it becomes loose or will not move. °· You have an external hinge and you develop drainage around the pins. °· Your pain becomes worse rather than better. °· You lose feeling in your hand or fingers. °MAKE SURE YOU: °· Understand these instructions. °· Will watch your condition. °· Will get help right away if you are not doing well or get worse. °  °This information is not intended to replace advice given to you by your health care provider. Make sure you discuss any questions you have with your health care provider. °  °Document Released: 10/15/2001 Document Revised: 11/11/2014 Document Reviewed: 06/05/2015 °[employer]vier Interactive Patient Education ©2016 [employer] ° °

## 2015-09-02 NOTE — ED Notes (Signed)
Pt presents with left elbow deformity secondary to a fall. 2+ Pulses palpated, pt c/o  numbness and tingling.

## 2015-09-02 NOTE — ED Notes (Signed)
Bed: AV40 Expected date:  Expected time:  Means of arrival:  Comments: EMS 44yo F fall, arm injury

## 2015-09-03 MED ORDER — CEFAZOLIN SODIUM-DEXTROSE 2-3 GM-% IV SOLR
2.0000 g | INTRAVENOUS | Status: AC
  Administered 2015-09-04: 2 g via INTRAVENOUS
  Filled 2015-09-03: qty 50

## 2015-09-03 NOTE — ED Provider Notes (Signed)
  Care assumed from Dr. Fayrene Fearing at shift change.  Briefly, 44 year old female with mechanical fall at home. She sustained a left elbow dislocation which was successfully reduced here in the ED without difficulty.  Plan: Awaiting post-reduction films. If negative, patient be discharged home with sling and orthopedic follow-up. If films show radial head fracture, she will need splint applied.  Results for orders placed or performed during the hospital encounter of 11/22/12  POCT rapid strep A [facility])  Result Value Ref Range   Streptococcus, Group A Screen (Direct) NEGATIVE NEGATIVE  Infectious mono screen, POC  Result Value Ref Range   Mono Screen NEGATIVE NEGATIVE  POCT urinalysis dip (device)  Result Value Ref Range   Glucose, UA NEGATIVE NEGATIVE mg/dL   Bilirubin Urine NEGATIVE NEGATIVE   Ketones, ur TRACE (A) NEGATIVE mg/dL   Specific Gravity, Urine 1.015 1.005 - 1.030   Hgb urine dipstick NEGATIVE NEGATIVE   pH 5.5 5.0 - 8.0   Protein, ur 30 (A) NEGATIVE mg/dL   Urobilinogen, UA 1.0 0.0 - 1.0 mg/dL   Nitrite NEGATIVE NEGATIVE   Leukocytes, UA NEGATIVE NEGATIVE  Pregnancy, urine POC  Result Value Ref Range   Preg Test, Ur NEGATIVE NEGATIVE   Dg Elbow 2 Views Left  09/02/2015  CLINICAL DATA:  Postreduction. EXAM: LEFT ELBOW - 2 VIEW COMPARISON:  RIGHT elbow radiograph September 02, 2015 at 2155 hours FINDINGS: Post reduction of elbow dislocation. Acute nondisplaced intra-articular radial head neck junction fracture. No destructive bony lesions. Elbow joint effusion. IMPRESSION: Closed reduction of elbow dislocation. Nondisplaced acute radial head neck fracture noted. Electronically Signed   By: Awilda Metro M.D.   On: 09/02/2015 23:25   Dg Elbow Complete Left  09/02/2015  CLINICAL DATA:  Subacute onset of left elbow pain, swelling and deformity. Initial encounter. EXAM: LEFT ELBOW - COMPLETE 3+ VIEW COMPARISON:  None. FINDINGS: There is dislocation of the elbow joint,  with shortening at the site of dislocation, and mild radial displacement. A small radial head fracture cannot be excluded, but is not well seen. Surrounding soft tissue swelling is noted. IMPRESSION: Dislocation of the elbow joint, with shortening at the site of dislocation, and mild radial displacement. Small radial head fracture cannot be excluded, but is not well seen. Electronically Signed   By: Roanna Raider M.D.   On: 09/02/2015 22:12   Post-reduction films do reveal small radial head fracture. Splint was applied by orthopedic tech. Patient will follow up with orthopedics.  Written for pain meds, wheelchair, and bedside commode per Dr. Fayrene Fearing for easier accessibility at home given her recent ankle dislocation a few days ago.  Discussed plan with patient, he/she acknowledged understanding and agreed with plan of care.  Return precautions given for new or worsening symptoms.    Garlon Hatchet, PA-C 09/03/15 [id number]  Rolland Porter, MD 09/20/15 [phone]

## 2015-09-04 ENCOUNTER — Ambulatory Visit (HOSPITAL_COMMUNITY): Payer: Medicaid Other | Admitting: Anesthesiology

## 2015-09-04 ENCOUNTER — Encounter (HOSPITAL_COMMUNITY): Payer: Self-pay

## 2015-09-04 ENCOUNTER — Inpatient Hospital Stay (HOSPITAL_COMMUNITY)
Admission: AD | Admit: 2015-09-04 | Discharge: 2015-09-07 | DRG: 494 | Disposition: A | Discharge status: Skilled Nursing Facility | Payer: Medicaid Other | Source: Ambulatory Visit | Attending: Orthopedic Surgery | Admitting: Orthopedic Surgery

## 2015-09-04 ENCOUNTER — Encounter (HOSPITAL_COMMUNITY)
Admission: AD | Disposition: A | Discharge status: Skilled Nursing Facility | Payer: Self-pay | Source: Ambulatory Visit | Attending: Orthopedic Surgery

## 2015-09-04 DIAGNOSIS — W19XXXA Unspecified fall, initial encounter: Secondary | ICD-10-CM | POA: Diagnosis present

## 2015-09-04 DIAGNOSIS — W1839XA Other fall on same level, initial encounter: Secondary | ICD-10-CM | POA: Diagnosis present

## 2015-09-04 DIAGNOSIS — Z79891 Long term (current) use of opiate analgesic: Secondary | ICD-10-CM | POA: Diagnosis not present

## 2015-09-04 DIAGNOSIS — S82852A Displaced trimalleolar fracture of left lower leg, initial encounter for closed fracture: Secondary | ICD-10-CM | POA: Diagnosis present

## 2015-09-04 DIAGNOSIS — M25572 Pain in left ankle and joints of left foot: Secondary | ICD-10-CM | POA: Diagnosis present

## 2015-09-04 DIAGNOSIS — D649 Anemia, unspecified: Secondary | ICD-10-CM | POA: Diagnosis present

## 2015-09-04 DIAGNOSIS — S82843A Displaced bimalleolar fracture of unspecified lower leg, initial encounter for closed fracture: Secondary | ICD-10-CM | POA: Diagnosis present

## 2015-09-04 DIAGNOSIS — S52135A Nondisplaced fracture of neck of left radius, initial encounter for closed fracture: Secondary | ICD-10-CM | POA: Diagnosis present

## 2015-09-04 DIAGNOSIS — F329 Major depressive disorder, single episode, unspecified: Secondary | ICD-10-CM | POA: Diagnosis present

## 2015-09-04 DIAGNOSIS — Z79899 Other long term (current) drug therapy: Secondary | ICD-10-CM | POA: Diagnosis not present

## 2015-09-04 HISTORY — PX: ORIF ANKLE FRACTURE: SHX5408

## 2015-09-04 HISTORY — DX: Personal history of other medical treatment: Z92.89

## 2015-09-04 LAB — CBC
HEMATOCRIT: 46.5 % — AB (ref 36.0–46.0)
HEMOGLOBIN: 15.9 g/dL — AB (ref 12.0–15.0)
MCH: 29.5 pg (ref 26.0–34.0)
MCHC: 34.2 g/dL (ref 30.0–36.0)
MCV: 86.3 fL (ref 78.0–100.0)
Platelets: 247 10*3/uL (ref 150–400)
RBC: 5.39 MIL/uL — ABNORMAL HIGH (ref 3.87–5.11)
RDW: 12.5 % (ref 11.5–15.5)
WBC: 9.2 10*3/uL (ref 4.0–10.5)

## 2015-09-04 LAB — SURGICAL PCR SCREEN
MRSA, PCR: NEGATIVE
Staphylococcus aureus: NEGATIVE

## 2015-09-04 SURGERY — OPEN REDUCTION INTERNAL FIXATION (ORIF) ANKLE FRACTURE
Anesthesia: Regional | Site: Ankle | Laterality: Left

## 2015-09-04 MED ORDER — PROPOFOL 500 MG/50ML IV EMUL
INTRAVENOUS | Status: DC | PRN
  Administered 2015-09-04: 50 ug/kg/min via INTRAVENOUS

## 2015-09-04 MED ORDER — ENOXAPARIN SODIUM 40 MG/0.4ML ~~LOC~~ SOLN
40.0000 mg | SUBCUTANEOUS | Status: DC
  Administered 2015-09-05 – 2015-09-07 (×3): 40 mg via SUBCUTANEOUS
  Filled 2015-09-04 (×3): qty 0.4

## 2015-09-04 MED ORDER — FENTANYL CITRATE (PF) 100 MCG/2ML IJ SOLN
INTRAMUSCULAR | Status: DC | PRN
  Administered 2015-09-04 (×2): 50 ug via INTRAVENOUS

## 2015-09-04 MED ORDER — PHENYLEPHRINE HCL 10 MG/ML IJ SOLN
INTRAMUSCULAR | Status: DC | PRN
  Administered 2015-09-04: 120 ug via INTRAVENOUS

## 2015-09-04 MED ORDER — DEXAMETHASONE SODIUM PHOSPHATE 4 MG/ML IJ SOLN
INTRAMUSCULAR | Status: AC
  Filled 2015-09-04: qty 1

## 2015-09-04 MED ORDER — ONDANSETRON HCL 4 MG/2ML IJ SOLN
INTRAMUSCULAR | Status: AC
  Filled 2015-09-04: qty 2

## 2015-09-04 MED ORDER — METOCLOPRAMIDE HCL 5 MG PO TABS: 5.0000 mg | ORAL_TABLET | Freq: Three times a day (TID) | ORAL | Status: DC | PRN

## 2015-09-04 MED ORDER — FENTANYL CITRATE (PF) 250 MCG/5ML IJ SOLN
INTRAMUSCULAR | Status: AC
  Filled 2015-09-04: qty 5

## 2015-09-04 MED ORDER — 0.9 % SODIUM CHLORIDE (POUR BTL) OPTIME
TOPICAL | Status: DC | PRN
  Administered 2015-09-04: 1000 mL

## 2015-09-04 MED ORDER — DIPHENHYDRAMINE HCL 12.5 MG/5ML PO ELIX
12.5000 mg | ORAL_SOLUTION | ORAL | Status: DC | PRN
  Administered 2015-09-06 – 2015-09-07 (×2): 25 mg via ORAL
  Filled 2015-09-04 (×2): qty 10

## 2015-09-04 MED ORDER — SODIUM CHLORIDE 0.9 % IV SOLN
INTRAVENOUS | Status: DC
  Administered 2015-09-04 – 2015-09-05 (×2): via INTRAVENOUS

## 2015-09-04 MED ORDER — OXYCODONE HCL 5 MG PO TABS
5.0000 mg | ORAL_TABLET | ORAL | Status: DC | PRN
  Administered 2015-09-04 – 2015-09-07 (×18): 10 mg via ORAL
  Filled 2015-09-04 (×18): qty 2

## 2015-09-04 MED ORDER — HYDROMORPHONE HCL 1 MG/ML IJ SOLN
INTRAMUSCULAR | Status: AC
  Administered 2015-09-04: 0.5 mg via INTRAVENOUS
  Filled 2015-09-04: qty 1

## 2015-09-04 MED ORDER — BUPIVACAINE-EPINEPHRINE (PF) 0.5% -1:200000 IJ SOLN
INTRAMUSCULAR | Status: AC
  Filled 2015-09-04: qty 30

## 2015-09-04 MED ORDER — ACETAMINOPHEN 325 MG PO TABS
650.0000 mg | ORAL_TABLET | Freq: Four times a day (QID) | ORAL | Status: DC | PRN
  Administered 2015-09-04: 650 mg via ORAL
  Filled 2015-09-04: qty 2

## 2015-09-04 MED ORDER — PROPOFOL 10 MG/ML IV BOLUS
INTRAVENOUS | Status: DC | PRN
  Administered 2015-09-04: 20 mg via INTRAVENOUS
  Administered 2015-09-04: 100 mg via INTRAVENOUS

## 2015-09-04 MED ORDER — PROPOFOL 10 MG/ML IV BOLUS
INTRAVENOUS | Status: AC
  Filled 2015-09-04: qty 20

## 2015-09-04 MED ORDER — ONDANSETRON HCL 4 MG PO TABS: 4.0000 mg | ORAL_TABLET | Freq: Four times a day (QID) | ORAL | Status: DC | PRN

## 2015-09-04 MED ORDER — MUPIROCIN 2 % EX OINT
TOPICAL_OINTMENT | CUTANEOUS | Status: AC
  Administered 2015-09-04: 12:00:00 via NASAL
  Filled 2015-09-04: qty 22

## 2015-09-04 MED ORDER — GLYCOPYRROLATE 0.2 MG/ML IJ SOLN
INTRAMUSCULAR | Status: DC | PRN
  Administered 2015-09-04: 0.2 mg via INTRAVENOUS

## 2015-09-04 MED ORDER — MUPIROCIN 2 % EX OINT
1.0000 "application " | TOPICAL_OINTMENT | Freq: Once | CUTANEOUS | Status: AC
  Administered 2015-09-04: 1 via TOPICAL

## 2015-09-04 MED ORDER — HYDROMORPHONE HCL 1 MG/ML IJ SOLN
0.2500 mg | INTRAMUSCULAR | Status: DC | PRN
  Administered 2015-09-04 (×2): 0.5 mg via INTRAVENOUS

## 2015-09-04 MED ORDER — LACTATED RINGERS IV SOLN
INTRAVENOUS | Status: DC
  Administered 2015-09-04 (×3): via INTRAVENOUS

## 2015-09-04 MED ORDER — MORPHINE SULFATE (PF) 2 MG/ML IV SOLN
2.0000 mg | INTRAVENOUS | Status: DC | PRN
  Administered 2015-09-04 – 2015-09-07 (×10): 2 mg via INTRAVENOUS
  Filled 2015-09-04 (×10): qty 1

## 2015-09-04 MED ORDER — SENNA 8.6 MG PO TABS
1.0000 | ORAL_TABLET | Freq: Two times a day (BID) | ORAL | Status: DC
  Administered 2015-09-04 – 2015-09-07 (×6): 8.6 mg via ORAL
  Filled 2015-09-04 (×6): qty 1

## 2015-09-04 MED ORDER — ACETAMINOPHEN 650 MG RE SUPP: 650.0000 mg | Freq: Four times a day (QID) | RECTAL | Status: DC | PRN

## 2015-09-04 MED ORDER — SUCCINYLCHOLINE CHLORIDE 20 MG/ML IJ SOLN
INTRAMUSCULAR | Status: DC | PRN
  Administered 2015-09-04: 100 mg via INTRAVENOUS

## 2015-09-04 MED ORDER — LIDOCAINE HCL (CARDIAC) 20 MG/ML IV SOLN
INTRAVENOUS | Status: AC
  Filled 2015-09-04: qty 5

## 2015-09-04 MED ORDER — FENTANYL CITRATE (PF) 100 MCG/2ML IJ SOLN
INTRAMUSCULAR | Status: AC
  Administered 2015-09-04: 100 ug
  Filled 2015-09-04: qty 2

## 2015-09-04 MED ORDER — MEPERIDINE HCL 25 MG/ML IJ SOLN
6.2500 mg | INTRAMUSCULAR | Status: DC | PRN
Start: 2015-09-04 — End: 2015-09-04

## 2015-09-04 MED ORDER — ONDANSETRON HCL 4 MG/2ML IJ SOLN: 4.0000 mg | Freq: Once | INTRAMUSCULAR | Status: DC | PRN

## 2015-09-04 MED ORDER — MIDAZOLAM HCL 2 MG/2ML IJ SOLN
INTRAMUSCULAR | Status: AC
  Administered 2015-09-04: 2 mg
  Filled 2015-09-04: qty 2

## 2015-09-04 MED ORDER — LIDOCAINE HCL (CARDIAC) 20 MG/ML IV SOLN
INTRAVENOUS | Status: DC | PRN
  Administered 2015-09-04: 50 mg via INTRAVENOUS

## 2015-09-04 MED ORDER — DOCUSATE SODIUM 100 MG PO CAPS
100.0000 mg | ORAL_CAPSULE | Freq: Two times a day (BID) | ORAL | Status: DC
  Administered 2015-09-04 – 2015-09-07 (×6): 100 mg via ORAL
  Filled 2015-09-04 (×6): qty 1

## 2015-09-04 MED ORDER — MIDAZOLAM HCL 2 MG/2ML IJ SOLN
INTRAMUSCULAR | Status: AC
  Filled 2015-09-04: qty 4

## 2015-09-04 MED ORDER — CHLORHEXIDINE GLUCONATE 4 % EX LIQD: 60.0000 mL | Freq: Once | CUTANEOUS | Status: DC

## 2015-09-04 MED ORDER — ONDANSETRON HCL 4 MG/2ML IJ SOLN
INTRAMUSCULAR | Status: DC | PRN
  Administered 2015-09-04: 4 mg via INTRAVENOUS

## 2015-09-04 MED ORDER — DEXAMETHASONE SODIUM PHOSPHATE 4 MG/ML IJ SOLN
INTRAMUSCULAR | Status: DC | PRN
  Administered 2015-09-04: 4 mg via INTRAVENOUS

## 2015-09-04 MED ORDER — METOCLOPRAMIDE HCL 5 MG/ML IJ SOLN
5.0000 mg | Freq: Three times a day (TID) | INTRAMUSCULAR | Status: DC | PRN
  Administered 2015-09-07: 10 mg via INTRAVENOUS
  Filled 2015-09-04: qty 2

## 2015-09-04 MED ORDER — ONDANSETRON HCL 4 MG/2ML IJ SOLN: 4.0000 mg | Freq: Four times a day (QID) | INTRAMUSCULAR | Status: DC | PRN

## 2015-09-04 MED ORDER — MIDAZOLAM HCL 5 MG/5ML IJ SOLN
INTRAMUSCULAR | Status: DC | PRN
  Administered 2015-09-04: 2 mg via INTRAVENOUS

## 2015-09-04 MED ORDER — BUPIVACAINE-EPINEPHRINE 0.5% -1:200000 IJ SOLN
INTRAMUSCULAR | Status: DC | PRN
  Administered 2015-09-04: 10 mL

## 2015-09-04 MED ORDER — SODIUM CHLORIDE 0.9 % IV SOLN
INTRAVENOUS | Status: DC
Start: 2015-09-04 — End: 2015-09-04

## 2015-09-04 SURGICAL SUPPLY — 65 items
BANDAGE ESMARK 6X9 LF (GAUZE/BANDAGES/DRESSINGS) ×1 IMPLANT
BIT DRILL 2.5X2.75 QC CALB (BIT) ×2 IMPLANT
BIT DRILL 2.9 CANN QC NONSTRL (BIT) ×2 IMPLANT
BIT DRILL 3.5X5.5 QC CALB (BIT) ×2 IMPLANT
BLADE SURG 15 STRL LF DISP TIS (BLADE) ×1 IMPLANT
BLADE SURG 15 STRL SS (BLADE) ×1
BNDG COHESIVE 4X5 TAN STRL (GAUZE/BANDAGES/DRESSINGS) ×2 IMPLANT
BNDG COHESIVE 6X5 TAN STRL LF (GAUZE/BANDAGES/DRESSINGS) ×2 IMPLANT
BNDG ESMARK 6X9 LF (GAUZE/BANDAGES/DRESSINGS) ×2
CANISTER SUCT 3000ML PPV (MISCELLANEOUS) ×2 IMPLANT
CHLORAPREP W/TINT 26ML (MISCELLANEOUS) ×2 IMPLANT
COVER SURGICAL LIGHT HANDLE (MISCELLANEOUS) ×2 IMPLANT
CUFF TOURNIQUET SINGLE 34IN LL (TOURNIQUET CUFF) ×2 IMPLANT
CUFF TOURNIQUET SINGLE 44IN (TOURNIQUET CUFF) IMPLANT
DRAPE OEC MINIVIEW 54X84 (DRAPES) ×2 IMPLANT
DRAPE U-SHAPE 47X51 STRL (DRAPES) ×2 IMPLANT
DRSG ADAPTIC 3X8 NADH LF (GAUZE/BANDAGES/DRESSINGS) IMPLANT
DRSG MEPITEL 3X4 ME34 (GAUZE/BANDAGES/DRESSINGS) ×2 IMPLANT
DRSG PAD ABDOMINAL 8X10 ST (GAUZE/BANDAGES/DRESSINGS) ×2 IMPLANT
ELECT REM PT RETURN 9FT ADLT (ELECTROSURGICAL) ×2
ELECTRODE REM PT RTRN 9FT ADLT (ELECTROSURGICAL) ×1 IMPLANT
GAUZE SPONGE 4X4 12PLY STRL (GAUZE/BANDAGES/DRESSINGS) ×2 IMPLANT
GLOVE BIO SURGEON STRL SZ8 (GLOVE) ×2 IMPLANT
GLOVE BIOGEL PI IND STRL 8 (GLOVE) ×1 IMPLANT
GLOVE BIOGEL PI INDICATOR 8 (GLOVE) ×1
GLOVE ECLIPSE 7.5 STRL STRAW (GLOVE) ×2 IMPLANT
GOWN STRL REUS W/ TWL LRG LVL3 (GOWN DISPOSABLE) ×1 IMPLANT
GOWN STRL REUS W/ TWL XL LVL3 (GOWN DISPOSABLE) ×2 IMPLANT
GOWN STRL REUS W/TWL LRG LVL3 (GOWN DISPOSABLE) ×1
GOWN STRL REUS W/TWL XL LVL3 (GOWN DISPOSABLE) ×2
K-WIRE ACE 1.6X6 (WIRE) ×2
KIT BASIN OR (CUSTOM PROCEDURE TRAY) ×2 IMPLANT
KIT ROOM TURNOVER OR (KITS) ×2 IMPLANT
KWIRE ACE 1.6X6 (WIRE) ×1 IMPLANT
NEEDLE 22X1 1/2 (OR ONLY) (NEEDLE) IMPLANT
NS IRRIG 1000ML POUR BTL (IV SOLUTION) ×2 IMPLANT
PACK ORTHO EXTREMITY (CUSTOM PROCEDURE TRAY) ×2 IMPLANT
PAD ARMBOARD 7.5X6 YLW CONV (MISCELLANEOUS) ×4 IMPLANT
PAD CAST 4YDX4 CTTN HI CHSV (CAST SUPPLIES) ×1 IMPLANT
PADDING CAST COTTON 4X4 STRL (CAST SUPPLIES) ×1
PADDING CAST COTTON 6X4 STRL (CAST SUPPLIES) ×2 IMPLANT
PLATE ACE 100DEG 8HOLE (Plate) ×2 IMPLANT
SCREW ACE CAN 4.0 40M (Screw) ×2 IMPLANT
SCREW ACE CAN 4.0 42M (Screw) ×4 IMPLANT
SCREW CORT 3.5X16 815037016 (Screw) ×8 IMPLANT
SCREW CORTICAL 3.5MM  20MM (Screw) ×1 IMPLANT
SCREW CORTICAL 3.5MM 18MM (Screw) ×2 IMPLANT
SCREW CORTICAL 3.5MM 20MM (Screw) ×1 IMPLANT
SCREW CORTICAL 3.5MM 22MM (Screw) ×2 IMPLANT
SPLINT PLASTER CAST XFAST 5X30 (CAST SUPPLIES) ×1 IMPLANT
SPLINT PLASTER XFAST SET 5X30 (CAST SUPPLIES) ×1
SPONGE LAP 18X18 X RAY DECT (DISPOSABLE) ×2 IMPLANT
STAPLER VISISTAT 35W (STAPLE) IMPLANT
SUCTION FRAZIER TIP 10 FR DISP (SUCTIONS) ×2 IMPLANT
SUT MNCRL AB 3-0 PS2 18 (SUTURE) IMPLANT
SUT PROLENE 3 0 PS 2 (SUTURE) ×2 IMPLANT
SUT VIC AB 2-0 CT1 27 (SUTURE) ×2
SUT VIC AB 2-0 CT1 TAPERPNT 27 (SUTURE) ×2 IMPLANT
SUT VIC AB 3-0 PS2 18 (SUTURE) ×1
SUT VIC AB 3-0 PS2 18XBRD (SUTURE) ×1 IMPLANT
SYR CONTROL 10ML LL (SYRINGE) IMPLANT
TOWEL OR 17X24 6PK STRL BLUE (TOWEL DISPOSABLE) ×2 IMPLANT
TOWEL OR 17X26 10 PK STRL BLUE (TOWEL DISPOSABLE) ×2 IMPLANT
TUBE CONNECTING 12X1/4 (SUCTIONS) ×2 IMPLANT
WATER STERILE IRR 1000ML POUR (IV SOLUTION) ×2 IMPLANT

## 2015-09-04 NOTE — Brief Op Note (Signed)
09/04/2015  5:06 PM  PATIENT:  Evelyn Kline  44 y.o. female  PRE-OPERATIVE DIAGNOSIS:  Left ankle bimalleolar fracture  POST-OPERATIVE DIAGNOSIS:  LEFT MALLEOLAR FRACTURE  Procedure(s): 1.  OPEN REDUCTION INTERNAL FIXATION (ORIF) LEFT ankle triMALLEOLAR FRACTURE with fixation of the posterior lip 2.  AP, mortise and lateral xrays of the left ankle  SURGEON:  Toni Arthurs, MD  ASSISTANT: Alfredo Martinez, PA-C  ANESTHESIA:   General, regional  EBL:  minimal   TOURNIQUET:   Total Tourniquet Time Documented: Thigh (Left) - 91 minutes Total: Thigh (Left) - 91 minutes  COMPLICATIONS:  None apparent  DISPOSITION:  Extubated, awake and stable to recovery.  DICTATION ID:  [id number]

## 2015-09-04 NOTE — Discharge Instructions (Signed)
John Hewitt, MD °[facility] ° °Please read the following information regarding your care after surgery. ° °Medications  °You only need a prescription for the narcotic pain medicine (ex. oxycodone, Percocet, Norco).  All of the other medicines listed below are available over the counter. °X acetominophen (Tylenol) 650 mg every 4-6 hours as you need for minor pain °X oxycodone as prescribed for moderate to severe pain °?  ° °Narcotic pain medicine (ex. oxycodone, Percocet, Vicodin) will cause constipation.  To prevent this problem, take the following medicines while you are taking any pain medicine. °X docusate sodium (Colace) 100 mg twice a day X senna (Senokot) 2 tablets twice a day ° °X To help prevent blood clots, take an aspirin (325 mg) once a day for a month after surgery.  You should also get up every hour while you are awake to move around.   ° °Weight Bearing °X Do not bear any weight on the operated leg or foot. ° °Cast / Splint / Dressing °X Keep your splint or cast clean and dry.  Don’t put anything (coat hanger, pencil, etc) down inside of it.  If it gets damp, use a hair dryer on the cool setting to dry it.  If it gets soaked, call the office to schedule an appointment for a cast change. °  ° °After your dressing, cast or splint is removed; you may shower, but do not soak or scrub the wound.  Allow the water to run over it, and then gently pat it dry. ° °Swelling °It is normal for you to have swelling where you had surgery.  To reduce swelling and pain, keep your toes above your nose for at least 3 days after surgery.  It may be necessary to keep your foot or leg elevated for several weeks.  If it hurts, it should be elevated. ° °Follow Up °Call my office at [phone] when you are discharged from the hospital or surgery center to schedule an appointment to be seen two weeks after surgery. ° °Call my office at [phone] if you develop a fever >101.5° F, nausea, vomiting, bleeding from  the surgical site or severe pain.   ° ° °

## 2015-09-04 NOTE — Transfer of Care (Signed)
Immediate Anesthesia Transfer of Care Note  Patient: Evelyn Kline  Procedure(s) Performed: Procedure(s): OPEN REDUCTION INTERNAL FIXATION (ORIF) LEFT BIOMALLEOLAR FRACTURE (Left)  Patient Location: PACU  Anesthesia Type:GA combined with regional for post-op pain  Level of Consciousness: awake, oriented and patient cooperative  Airway & Oxygen Therapy: Patient Spontanous Breathing and Patient connected to face mask oxygen  Post-op Assessment: Report given to RN and Post -op Vital signs reviewed and stable  Post vital signs: Reviewed  Last Vitals:  Filed Vitals:   09/04/15 1410  BP:   Pulse: 88  Temp:   Resp: 26    Complications: No apparent anesthesia complications

## 2015-09-04 NOTE — Anesthesia Procedure Notes (Addendum)
Procedure Name: MAC Date/Time: 09/04/2015 3:10 PM Performed by: Lovie Chol Pre-anesthesia Checklist: Patient identified, Emergency Drugs available, Suction available, Patient being monitored and Timeout performed Patient Re-evaluated:Patient Re-evaluated prior to inductionOxygen Delivery Method: Simple face mask   Procedure Name: Intubation Date/Time: 09/04/2015 3:29 PM Performed by: Lovie Chol Pre-anesthesia Checklist: Patient identified, Emergency Drugs available, Suction available, Patient being monitored and Timeout performed Patient Re-evaluated:Patient Re-evaluated prior to inductionOxygen Delivery Method: Circle system utilized Preoxygenation: Pre-oxygenation with 100% oxygen Intubation Type: Rapid sequence and Cricoid Pressure applied Laryngoscope Size: Miller and 2 Grade View: Grade I Tube type: Oral Tube size: 7.0 mm Number of attempts: 1 Airway Equipment and Method: Stylet Placement Confirmation: ETT inserted through vocal cords under direct vision,  positive ETCO2,  CO2 detector and breath sounds checked- equal and bilateral Secured at: 22 cm Tube secured with: Tape Dental Injury: Teeth and Oropharynx as per pre-operative assessment

## 2015-09-04 NOTE — Anesthesia Postprocedure Evaluation (Signed)
Anesthesia Post Note  Patient: Evelyn Kline  Procedure(s) Performed: Procedure(s) (LRB): OPEN REDUCTION INTERNAL FIXATION (ORIF) LEFT BIOMALLEOLAR FRACTURE (Left)  Anesthesia type: general  Patient location: PACU  Post pain: Pain level controlled  Post assessment: Patient's Cardiovascular Status Stable  Last Vitals:  Filed Vitals:   09/04/15 1410  BP:   Pulse: 88  Temp:   Resp: 26    Post vital signs: Reviewed and stable  Level of consciousness: sedated  Complications: No apparent anesthesia complications

## 2015-09-04 NOTE — Anesthesia Preprocedure Evaluation (Addendum)
Anesthesia Evaluation  Patient identified by MRN, date of birth, ID band Patient awake    Reviewed: Allergy & Precautions, NPO status , Patient's Chart, lab work & pertinent test results  History of Anesthesia Complications Negative for: history of anesthetic complications  Airway Mallampati: I  TM Distance: >3 FB Neck ROM: Full    Dental  (+) Teeth Intact, Dental Advisory Given   Pulmonary neg pulmonary ROS,    Pulmonary exam normal breath sounds clear to auscultation       Cardiovascular negative cardio ROS Normal cardiovascular exam Rhythm:Regular Rate:Tachycardia     Neuro/Psych PSYCHIATRIC DISORDERS Depression negative neurological ROS     GI/Hepatic negative GI ROS, Neg liver ROS,   Endo/Other  negative endocrine ROS  Renal/GU negative Renal ROS     Musculoskeletal   Abdominal   Peds  Hematology   Anesthesia Other Findings   Reproductive/Obstetrics negative OB ROS                            Anesthesia Physical Anesthesia Plan  ASA: II  Anesthesia Plan: Regional   Post-op Pain Management:    Induction: Intravenous  Airway Management Planned: Simple Face Mask  Additional Equipment:   Intra-op Plan:   Post-operative Plan:   Informed Consent: I have reviewed the patients History and Physical, chart, labs and discussed the procedure including the risks, benefits and alternatives for the proposed anesthesia with the patient or authorized representative who has indicated his/her understanding and acceptance.     Plan Discussed with: CRNA and Surgeon  Anesthesia Plan Comments:         Anesthesia Quick Evaluation

## 2015-09-04 NOTE — H&P (Signed)
Evelyn Kline is an 44 y.o. female.   Chief Complaint:  Left ankle pain HPI:  44 y/o female without significant PMH fell last week injuring her left ankle.  She underwent closed reduction in the ER and presents today for ORIF of the left ankle bimalleolar fracture.  In the interim she fell again at home injuring her left elbow.  She underwent closed reduction and splinting after reduction of the elbow dislocation.  She also has a non displaced fracture of the radial neck.  She denies numbness or tingling of the L UE.  Past Medical History  Diagnosis Date  . Anemia   . Bowel obstruction (HCC) 2012  . Depression   . History of blood transfusion     Past Surgical History  Procedure Laterality Date  . Abdominal hysterectomy    . Small intestine surgery  2012  . Laceration repair Right     arm with artery involvement  . Tubal ligation    . Wisdom tooth extraction    . Vaginal delivery      x3, epidural anesth. with 2 of them    Family History  Problem Relation Age of Onset  . Diabetes Other   . Hypertension Other   . Diabetes Father    Social History:  reports that she has never smoked. She has never used smokeless tobacco. She reports that she does not drink alcohol or use illicit drugs.  Allergies: No Known Allergies  Medications Prior to Admission  Medication Sig Dispense Refill  . ibuprofen (ADVIL,MOTRIN) 800 MG tablet Take 1 tablet (800 mg total) by mouth 3 (three) times daily. 21 tablet 0  . ondansetron (ZOFRAN ODT) 4 MG disintegrating tablet Take 1 tablet (4 mg total) by mouth every 8 (eight) hours as needed for nausea. 10 tablet 0  . oxyCODONE-acetaminophen (PERCOCET/ROXICET) 5-325 MG tablet Take 2 tablets by mouth every 4 (four) hours as needed. 20 tablet 0  . tiZANidine (ZANAFLEX) 4 MG tablet Take 8 mg by mouth 3 (three) times daily as needed for muscle spasms.   0    Results for orders placed or performed during the hospital encounter of 09/04/15 (from the past 48  hour(s))  CBC     Status: Abnormal   Collection Time: 09/04/15  1:11 PM  Result Value Ref Range   WBC 9.2 4.0 - 10.5 K/uL   RBC 5.39 (H) 3.87 - 5.11 MIL/uL   Hemoglobin 15.9 (H) 12.0 - 15.0 g/dL   HCT 40.9 (H) 81.1 - 91.4 %   MCV 86.3 78.0 - 100.0 fL   MCH 29.5 26.0 - 34.0 pg   MCHC 34.2 30.0 - 36.0 g/dL   RDW 78.2 95.6 - 21.3 %   Platelets 247 150 - 400 K/uL   Dg Elbow 2 Views Left  09/02/2015  CLINICAL DATA:  Postreduction. EXAM: LEFT ELBOW - 2 VIEW COMPARISON:  RIGHT elbow radiograph September 02, 2015 at 2155 hours FINDINGS: Post reduction of elbow dislocation. Acute nondisplaced intra-articular radial head neck junction fracture. No destructive bony lesions. Elbow joint effusion. IMPRESSION: Closed reduction of elbow dislocation. Nondisplaced acute radial head neck fracture noted. Electronically Signed   By: Awilda Metro M.D.   On: 09/02/2015 23:25   Dg Elbow Complete Left  09/02/2015  CLINICAL DATA:  Subacute onset of left elbow pain, swelling and deformity. Initial encounter. EXAM: LEFT ELBOW - COMPLETE 3+ VIEW COMPARISON:  None. FINDINGS: There is dislocation of the elbow joint, with shortening at the site of dislocation,  and mild radial displacement. A small radial head fracture cannot be excluded, but is not well seen. Surrounding soft tissue swelling is noted. IMPRESSION: Dislocation of the elbow joint, with shortening at the site of dislocation, and mild radial displacement. Small radial head fracture cannot be excluded, but is not well seen. Electronically Signed   By: Roanna Raider M.D.   On: 09/02/2015 22:12    ROS  No recent f/c/n/v/ wt loss.  Blood pressure 124/76, pulse 94, temperature 98.1 F (36.7 C), temperature source Oral, resp. rate 22, height  (1.676 m), weight 75.297 kg (166 lb), SpO2 100 %. Physical Exam  wn wd female in nad.  A and O x 4.  Mood and affect normal.  EOMI.  resp unlabored.  L elbow splinted.  L hand with brisk cap refill.  L ankle  splinted.  Sens to LT intact at the toes dorsally and plantarly.  5/5 strength in PF and DF of the toes.  Assessment/Plan L ankle bimal fracture dislocation - to OR today for ORIF of this displaced unstable injury.    L elbow fracture dislocation - continue splint and keep NWB.  The risks and benefits of the alternative treatment options have been discussed in detail.  The patient wishes to proceed with surgery and specifically understands risks of bleeding, infection, nerve damage, blood clots, need for additional surgery, amputation and death.   Toni Arthurs 2015/09/19, 2:12 PM

## 2015-09-05 ENCOUNTER — Encounter (HOSPITAL_COMMUNITY): Payer: Self-pay | Admitting: Orthopedic Surgery

## 2015-09-05 NOTE — Evaluation (Signed)
Physical Therapy Evaluation Patient Details Name: Evelyn Kline MRN: [id number] DOB: 1971/03/09 Today's Date: 09/05/2015   History of Present Illness  Pt seen in ED on 10/26 after fall with L bimalleolar fx for which a closed reduction was performed. Pt returned home on crutches and had a second fall 10/29 in which she dislocated her L elbow and fractured her L radial neck. A closed reduction was performed in the ED on her L elbow and wrist splinted with pt placed in a sling. Pt returned for ORIF of L ankle 10/31. No significant PMH.  Clinical Impression  Patient is s/p above resulting in functional limitations due to the deficits listed below (see PT Problem List).  Patient will benefit from skilled PT to increase their independence and safety with mobility to allow discharge to the venue listed below.      Follow Up Recommendations SNF;Supervision for mobility/OOB    Equipment Recommendations  None recommended by PT;Other (comment) (to be assessed at next level of care)    Recommendations for Other Services       Precautions / Restrictions Precautions Precautions: Fall Required Braces or Orthoses: Sling Restrictions Weight Bearing Restrictions: Yes LUE Weight Bearing: Non weight bearing LLE Weight Bearing: Non weight bearing      Mobility  Bed Mobility Overal bed mobility: +2 for physical assistance;Needs Assistance Bed Mobility: Supine to Sit     Supine to sit: +2 for physical assistance;Max assist     General bed mobility comments: assist with bilateral LEs and trunk with pivot turn to edge of bed. Patient assisting with Rt UE using rail as able.   Transfers Overall transfer level: Needs assistance Equipment used: None Transfers: Sit to/from Stand Sit to Stand: Mod assist;Min assist;+2 physical assistance Stand pivot transfers: +2 physical assistance;Mod assist;Min assist       General transfer comment: sit to stand with weight on Rt LE. Able to pivot to chair  (to right side) with physical assist for balance.   Ambulation/Gait                Stairs            Wheelchair Mobility    Modified Rankin (Stroke Patients Only)       Balance Overall balance assessment: Needs assistance Sitting-balance support: No upper extremity supported Sitting balance-Leahy Scale: Fair     Standing balance support: Single extremity supported Standing balance-Leahy Scale: Poor Standing balance comment: needing physical assistace for standing balance.                              Pertinent Vitals/Pain Pain Assessment: 0-10 Pain Score: 8  Faces Pain Scale: Hurts even more Pain Location: Lt ankle Pain Descriptors / Indicators: Sore;Aching Pain Intervention(s): Limited activity within patient's tolerance;Monitored during session    Home Living Family/patient expects to be discharged to:: Skilled nursing facility                      Prior Function Level of Independence: Independent (prior to initial fall)               Hand Dominance       Extremity/Trunk Assessment   Upper Extremity Assessment: Defer to OT evaluation                 Communication   Communication: No difficulties  Cognition Arousal/Alertness: Awake/alert Behavior During Therapy: WFL for tasks assessed/performed Overall Cognitive  Status: Within Functional Limits for tasks assessed                      General Comments      Exercises        Assessment/Plan    PT Assessment Patient needs continued PT services  PT Diagnosis Difficulty walking;Acute pain   PT Problem List Decreased strength;Decreased range of motion;Decreased activity tolerance;Decreased balance;Decreased mobility;Pain  PT Treatment Interventions DME instruction;Functional mobility training;Therapeutic activities;Therapeutic exercise;Patient/family education   PT Goals (Current goals can be found in the Care Plan section) Acute Rehab PT  Goals Patient Stated Goal: be able to get back to moving again PT Goal Formulation: With patient Time For Goal Achievement: 09/19/15 Potential to Achieve Goals: Good    Frequency Min 3X/week   Barriers to discharge        Co-evaluation               End of Session Equipment Utilized During Treatment: Gait belt Activity Tolerance: Patient tolerated treatment well Patient left: in chair;Other (comment) (Lt UE and LE supported with pillows for comfort/elevation ) Nurse Communication: Mobility status;Weight bearing status         Time: 1610-9604 PT Time Calculation (min) (ACUTE ONLY): 26 min   Charges:   PT Evaluation $Initial PT Evaluation Tier I: 1 Procedure PT Treatments $Therapeutic Activity: 8-22 mins   PT G Codes:        Christiane Ha, PT, CSCS Pager [phone] Office [phone]  09/05/2015, 3:56 PM

## 2015-09-05 NOTE — Progress Notes (Signed)
Subjective: 1 Day Post-Op Procedure(s) (LRB): OPEN REDUCTION INTERNAL FIXATION (ORIF) LEFT BIOMALLEOLAR FRACTURE (Left)  Patient reports pain as mild to moderate.  Denies fever, chills, N/V.  Tolerating POs well, admits to eating a little this am.  Denies BM, but admits to flatulence.  Patient is resting comfortably in chair, and reports that she has been up with therapy today.  Objective:   VITALS:  Temp:  [98 F (36.7 C)-98.4 F (36.9 C)] 98.4 F (36.9 C) (11/01 0240) Pulse Rate:  [85-103] 95 (11/01 0240) Resp:  [18-28] 18 (11/01 0240) BP: (113-134)/(70-94) 122/80 mmHg (11/01 0240) SpO2:  [92 %-100 %] 95 % (11/01 0240) Weight:  [75.297 kg (166 lb)] 75.297 kg (166 lb) (10/31 1143)  General: WDWN patient in NAD. Psych:  Appropriate mood and affect. Neuro:  A&O x 3, Moving all extremities, sensation intact to light touch HEENT:  EOMs intact Chest:  Even non-labored respirations Skin:  Incision C/D/I, no rashes or lesions Extremities: warm/dry, no visible edema, erythema, or echymosis.  No lymphadenopathy. Pulses: Popliteus 2+ MSK:  ROM: EHL/FHL intact, MMT: patient can perform quad set,     LABS  Recent Labs  09/04/15 1311  HGB 15.9*  WBC 9.2  PLT 247   No results for input(s): NA, K, CL, CO2, BUN, CREATININE, GLUCOSE in the last 72 hours. No results for input(s): LABPT, INR in the last 72 hours.   Assessment/Plan: 1 Day Post-Op Procedure(s) (LRB): OPEN REDUCTION INTERNAL FIXATION (ORIF) LEFT BIOMALLEOLAR FRACTURE (Left)  Up with therapy  NWB L Upper and Lower extremity Therapy to help guide discharge disposition  Alfredo Martinez, Cordelia Poche, ATC [employer] Office:  [phone]

## 2015-09-05 NOTE — Evaluation (Signed)
Occupational Therapy Evaluation Patient Details Name: Evelyn Kline MRN: [id number] DOB: 1971/02/13 Today's Date: 09/05/2015    History of Present Illness Pt seen in ED on 10/26 after fall with L bimalleolar fx for which a closed reduction was performed. Pt returned home on crutches and had a second fall 10/29 in which she dislocated her L elbow and fractured her L radial neck. A closed reduction was performed in the ED on her L elbow and wrist splinted with pt placed in a sling. Pt returned for ORIF of L ankle 10/31. No significant PMH.   Clinical Impression   Pt was independent prior to falls.  Presents with pain primarily in L ankle. Requires max to +2 assistance for bathing and dressing and set up for seated grooming. Pt does not have 24 hour assistance at home and will require SNF upon discharge.  Will follow acutely.    Follow Up Recommendations  SNF;Supervision/Assistance - 24 hour    Equipment Recommendations   (defer to next venue)    Recommendations for Other Services       Precautions / Restrictions Precautions Precautions: Fall Required Braces or Orthoses: Sling Restrictions Weight Bearing Restrictions: Yes LUE Weight Bearing: Non weight bearing LLE Weight Bearing: Non weight bearing      Mobility Bed Mobility               General bed mobility comments: pt in chair  Transfers                      Balance                                            ADL Overall ADL's : Needs assistance/impaired Eating/Feeding: Set up;Sitting   Grooming: Oral care;Wash/dry face;Set up;Sitting   Upper Body Bathing: Maximal assistance;Sitting   Lower Body Bathing: +2 for physical assistance;Maximal assistance;Sit to/from stand   Upper Body Dressing : Moderate assistance;Sitting   Lower Body Dressing: +2 for physical assistance;Total assistance;Sit to/from stand   Toilet Transfer: +2 for physical assistance;Moderate  assistance;Stand-pivot;BSC   Toileting- Clothing Manipulation and Hygiene: Minimal assistance;Sitting/lateral lean       Functional mobility during ADLs:  (non ambulatory)       Vision     Perception     Praxis      Pertinent Vitals/Pain Pain Assessment: Faces Faces Pain Scale: Hurts even more Pain Location: L ankle Pain Descriptors / Indicators: Operative site guarding;Grimacing Pain Intervention(s): Ice applied;Repositioned;Limited activity within patient's tolerance;Monitored during session;Premedicated before session     Hand Dominance Right   Extremity/Trunk Assessment Upper Extremity Assessment Upper Extremity Assessment: LUE deficits/detail LUE Deficits / Details: splinted and in sling. able to move fingers, moderate edema in fingers LUE: Unable to fully assess due to immobilization;Unable to fully assess due to pain LUE Coordination: decreased fine motor;decreased gross motor   Lower Extremity Assessment Lower Extremity Assessment: Defer to PT evaluation   Cervical / Trunk Assessment Cervical / Trunk Assessment: Normal   Communication Communication Communication: No difficulties   Cognition Arousal/Alertness: Awake/alert Behavior During Therapy: WFL for tasks assessed/performed Overall Cognitive Status: Within Functional Limits for tasks assessed                     General Comments       Exercises       Shoulder Instructions  Home Living Family/patient expects to be discharged to:: Skilled nursing facility                                        Prior Functioning/Environment Level of Independence: Independent (prior to first fall)             OT Diagnosis: Generalized weakness;Acute pain   OT Problem List: Decreased strength;Decreased activity tolerance;Decreased range of motion;Impaired balance (sitting and/or standing);Decreased coordination;Decreased knowledge of use of DME or AE;Pain;Impaired UE functional  use;Increased edema   OT Treatment/Interventions: Self-care/ADL training;DME and/or AE instruction;Patient/family education;Balance training;Therapeutic activities    OT Goals(Current goals can be found in the care plan section) Acute Rehab OT Goals Patient Stated Goal: return to independence OT Goal Formulation: With patient Time For Goal Achievement: 09/19/15 Potential to Achieve Goals: Good ADL Goals Pt Will Perform Upper Body Bathing: with min assist;sitting Pt Will Perform Upper Body Dressing: with min assist;sitting Pt Will Transfer to Toilet: with min assist;stand pivot transfer;bedside commode Pt Will Perform Toileting - Clothing Manipulation and hygiene: with min assist;sit to/from stand Additional ADL Goal #1: Pt will perform bed mobility with min assist in preparation for ADL at EOB.  OT Frequency: Min 2X/week   Barriers to D/C: Decreased caregiver support          Co-evaluation              End of Session Equipment Utilized During Treatment: Gait belt  Activity Tolerance: Patient limited by pain Patient left: in chair;with call bell/phone within reach;with chair alarm set   Time: 8413-2440 OT Time Calculation (min): 21 min Charges:  OT General Charges $OT Visit: 1 Procedure OT Evaluation $Initial OT Evaluation Tier I: 1 Procedure G-Codes:    Evern Bio 09/05/2015, 1:53 PM [phone]

## 2015-09-05 NOTE — Op Note (Signed)
Evelyn Kline, Evelyn Kline NO.:  [id number]  MEDICAL RECORD NO.:  [id number]  LOCATION:  [id number]                        FACILITY:  [facility]  PHYSICIAN:  Toni Arthurs, MD        DATE OF BIRTH:  12-10-1970  DATE OF PROCEDURE:  09/04/2015 DATE OF DISCHARGE:                              OPERATIVE REPORT   PREOPERATIVE DIAGNOSIS:  Left ankle bimalleolar fracture.  POSTOPERATIVE DIAGNOSIS:  Left ankle trimalleolar fracture.  PROCEDURE: 1. Open reduction and internal fixation of left ankle trimalleolar     fracture with fixation of the posterior lip. 2. AP, mortise, and lateral radiographs of the left ankle.  SURGEON:  Toni Arthurs, MD  ASSISTANT:  Alfredo Martinez, PA-C.  ANESTHESIA:  General, regional.  ESTIMATED BLOOD LOSS:  Minimal.  TOURNIQUET TIME:  91 minutes at 250 mmHg.  COMPLICATIONS:  None apparent.  DISPOSITION:  Extubated awake and stable to recovery.  INDICATIONS FOR PROCEDURE:  The patient is a 44 year old female without significant past medical history.  She slipped and injured her left ankle late last week.  Radiographs showed a bimalleolar fracture dislocation.  She was reduced and presents now for operative treatment of this unstable displaced fracture.  In the interim, she has fallen and fractured her left upper extremity.  She had an elbow dislocation with radial neck fracture that is nondisplaced.  She is splinted to the left upper extremity now.  She presents for ORIF of the left ankle fracture today.  She understands the risks and benefits, the alternative treatment options, and elects surgical treatment.  She specifically understands risks of bleeding, infection, nerve damage, blood clots, need for additional surgery, continued pain, posttraumatic arthritis, nonunion, amputation, and death.  PROCEDURE IN DETAIL:  After preoperative consent was obtained and the correct operative site was identified, the patient was brought to the operating room  and placed supine on the operating table.  General anesthesia was induced.  Preoperative antibiotics were administered. Surgical time-out was taken.  Left lower extremity was prepped and draped in standard sterile fashion with tourniquet around the thigh. The extremity was exsanguinated and the tourniquet was inflated to 250 mmHg.  A longitudinal incision was then made over the lateral malleolus. Sharp dissection was carried down through skin and subcutaneous tissue. Fracture site was identified and was noted to have significant comminution.  Fracture was pulled out to length and held with a tenaculum after irrigating copiously.  A 3.5 mm lag screw was then inserted from anterior to posterior across the fracture site.  An 8-hole 1/3rd tubular plate was then contoured to fit the lateral malleolus.  It was fixed proximally with 3 bicortical screws and distally with 2 bicortical screws.  The butterfly fragment proximally was then keyed in and held in place with a lobster claw.  A 3.5-mm fully-threaded lag screw was inserted from the plate into the fragment securing it appropriately.  AP and lateral radiographs confirmed appropriate reduction of the fracture and appropriate position and length of all hardware.  Attention was then turned to the medial aspect of the ankle where a longitudinal incision was made.  Sharp dissection was carried down through the skin and subcutaneous tissue.  The medial malleolus fracture site was identified.  It was irrigated and cleaned of all hematoma.  The posterior malleolus fracture was also identified through this incision. The posterior tibial tendon and neurovascular bundle were protected with a Multimedia programmer.  The fragment was rotated into place and a K-wire was inserted from the anterior aspect of the ankle across the fracture site. The lateral radiograph confirmed appropriate reduction of the fracture. A cannulated 4 mm partially-threaded screw was  then inserted.  It was noted to appropriately compress the fracture site in a lag fashion.  The medial malleolus fracture site was then cleaned of all periosteum.  It was reduced and held with a tenaculum.  Two K-wires were inserted across the fracture site.  AP and lateral radiographs confirmed appropriate position of the K-wire.  The K-wires were then utilized to insert two 4 mm x 40 mm partially-threaded cannulated screws.  All of the hardware was from the [employer] small frag set.  Both screws were noted to have excellent purchase.  K-wires were removed.  AP, mortise, and lateral radiographs confirmed appropriate position and length of all hardware and appropriate reduction of the fractures.  Both wounds were irrigated copiously and closed with Vicryl, Monocryl, and nylon.  Sterile dressings were applied followed by a well-padded and short-leg splint. Tourniquet was released at 91 minutes.  The patient was awakened from anesthesia and transported to the recovery room in stable condition.  FOLLOWUP PLAN:  Due to the patient's left upper extremity and left lower extremity injury, she will be admitted to the inpatient ward.  She will have Physical Therapy and Occupational Therapy consults.  She likely will require care in a skilled nursing Facility.  She will start Lovenox for DVT prophylaxis tomorrow.  RADIOGRAPHS:  AP, mortise, and lateral radiographs of the left ankle were obtained intraoperatively.  These show interval reduction and fixation of the medial, lateral, and posterior malleolus fractures. Hardware is appropriately positioned and of the appropriate length.  No other acute injuries are noted.  Alfredo Martinez, PA-C was present and scrubbed for the duration of the case.  His assistance was essential in positioning the patient, prepping and draping, gaining and maintaining exposure, performing the operation, closing and dressing the wounds, and applying the splint.     Toni Arthurs, MD     JH/MEDQ  D:  09/04/2015  T:  09/05/2015  Job:  [id number]

## 2015-09-06 MED ORDER — ACETAMINOPHEN 325 MG PO TABS: 650.0000 mg | ORAL_TABLET | Freq: Four times a day (QID) | ORAL | Status: DC | PRN

## 2015-09-06 MED ORDER — DOCUSATE SODIUM 100 MG PO CAPS: 100.0000 mg | ORAL_CAPSULE | Freq: Two times a day (BID) | ORAL | Status: DC

## 2015-09-06 MED ORDER — OXYCODONE HCL 5 MG PO TABS: 5.0000 mg | ORAL_TABLET | ORAL | Status: DC | PRN

## 2015-09-06 MED ORDER — ENOXAPARIN SODIUM 40 MG/0.4ML ~~LOC~~ SOLN: 40.0000 mg | SUBCUTANEOUS | Status: DC

## 2015-09-06 MED ORDER — SENNA 8.6 MG PO TABS: 1.0000 | ORAL_TABLET | Freq: Two times a day (BID) | ORAL | Status: DC

## 2015-09-06 NOTE — Clinical Social Work Note (Signed)
Clinical Social Work Assessment  Patient Details  Name: Evelyn Kline MRN: [id number] Date of Birth: 1971-06-08  Date of referral:  09/06/15               Reason for consult:  Facility Placement, Discharge Planning                Permission sought to share information with:  Oceanographer granted to share information::  Yes, Verbal Permission Granted  Name::        Agency::  [place], [employer], [place], [place]  Relationship::     Contact Information:     Housing/Transportation Living arrangements for the past 2 months:  Single Family Home Source of Information:  Patient Patient Interpreter Needed:  None Criminal Activity/Legal Involvement Pertinent to Current Situation/Hospitalization:  No - Comment as needed Significant Relationships:  Spouse Lives with:  Spouse Do you feel safe going back to the place where you live?  No (High fall risk.) Need for family participation in patient care:  No (Coment) (Patient's to update patient's wife.)  Care giving concerns:  Patient expressed no concerns at this time.   Social Worker assessment / plan:  CSW received referral for possible SNF placement at time of discharge. Patient understanding and agreeable to PT recommendation for SNF placement. CSW explained purpose of extended SNF bed search as patient's payer source [facility]) is limited in bed availability. Patient expressed understanding. CSW will continue to follow and assist with discharge planning needs.  Employment status:  Disabled (Comment on whether or not currently receiving Disability) Insurance information:  Medicaid In [place] PT Recommendations:  Skilled Nursing Facility Information / Referral to community resources:  Skilled Nursing Facility  Patient/Family's Response to care:  Patient understanding and agreeable to CSW plan of care.  Patient/Family's Understanding of and Emotional Response to Diagnosis, Current Treatment, and Prognosis:   Patient understanding and agreeable to CSW plan of care.  Emotional Assessment Appearance:  Appears stated age Attitude/Demeanor/Rapport:  Other (Pleasant.) Affect (typically observed):  Pleasant, Accepting, Quiet Orientation:  Oriented to Self, Oriented to Place, Oriented to  Time, Oriented to Situation Alcohol / Substance use:  Not Applicable Psych involvement (Current and /or in the community):  No (Comment) (Not appropriate on this admission.)  Discharge Needs  Concerns to be addressed:  No discharge needs identified Readmission within the last 30 days:  No Current discharge risk:  None Barriers to Discharge:  No Barriers Identified   Rod Mae, LCSW 09/06/2015, 11:45 AM [phone]

## 2015-09-06 NOTE — Clinical Social Work Placement (Signed)
   CLINICAL SOCIAL WORK PLACEMENT  NOTE  Date:  09/06/2015  Patient Details  Name: Evelyn Kline MRN: [id number] Date of Birth: [birth year]  Clinical Social Work is seeking post-discharge placement for this patient at the Skilled  Nursing Facility level of care (*CSW will initial, date and re-position this form in  chart as items are completed):  Yes   Patient/family provided with [facility] Clinical Social Work Department's list of facilities offering this level of care within the geographic area requested by the patient (or if unable, by the patient's family).  Yes   Patient/family informed of their freedom to choose among providers that offer the needed level of care, that participate in Medicare, Medicaid or managed care program needed by the patient, have an available bed and are willing to accept the patient.  Yes   Patient/family informed of [facility]'s ownership interest in [facility] and [facility], as well as of the fact that they are under no obligation to receive care at these facilities.  PASRR submitted to EDS on 09/06/15     PASRR number received on 09/06/15     Existing PASRR number confirmed on       FL2 transmitted to all facilities in geographic area requested by pt/family on 09/06/15     FL2 transmitted to all facilities within larger geographic area on 09/06/15     Patient informed that his/her managed care company has contracts with or will negotiate with certain facilities, including the following:            Patient/family informed of bed offers received.  Patient chooses bed at       Physician recommends and patient chooses bed at      Patient to be transferred to   on  .  Patient to be transferred to facility by       Patient family notified on   of transfer.  Name of family member notified:        PHYSICIAN Please sign FL2     Additional Comment:    _______________________________________________ Rod Mae,  LCSW 09/06/2015, 11:47 AM

## 2015-09-06 NOTE — Clinical Social Work Note (Signed)
CSW requested placement assist from CSW AD and Dr. Jacky Kindle. CSW received a bed offer from [facility], but the requested the LOG contact prior to the pt's arrival. CSW informed CSW AD that [facility] will not accept the pt before the contract is received by their office. CSW AD will submit the contract.   CSW spoke with the pt the bedside to explained the LOG SNF choice. Pt reported needing time talk to a family member about discharge plan.     Clinical Social Worker  Shawnya Mayor, MSW, LCSW [phone]

## 2015-09-06 NOTE — Discharge Summary (Addendum)
Physician Discharge Summary  Patient ID: Evelyn Kline MRN: [id number] DOB/AGE: May 30, 1971 44 y.o.  Admit date: 09/04/2015 Discharge date: 09/07/2015  Admission Diagnoses:  L radius fx, L elbow dislocation, L ankle fx.  HTN  Discharge Diagnoses:  Active Problems:   Bimalleolar fracture same s/p ORIF of ankle.  Discharged Condition: stable  Hospital Course: Pt was admited and underewent ORIF of the left ankle.  Post op course was unremaqrkable and both PT and OT recommended SNF for rehab.  She is dischargd to SNF for rehab to contineu PT and OT treatments.  Consults: None  Significant Diagnostic Studies: none  Treatments: surgery: as above  Discharge Exam: Blood pressure 116/78, pulse 91, temperature 98.4 F (36.9 C), temperature source Oral, resp. rate 18, height  (1.676 m), weight 75.297 kg (166 lb), SpO2 94 %. WN WD woman in nad.  A and O x 4.  Mood and affect nr.  EOMI.  L UE splinted.  NVI.  L LE splinted.  NVI.  Disposition: SNF  Discharge Instructions    Call MD / Call 911    Complete by:  As directed   If you experience chest pain or shortness of breath, CALL 911 and be transported to the hospital emergency room.  If you develope a fever above 101 F, pus (white drainage) or increased drainage or redness at the wound, or calf pain, call your surgeon's office.     Constipation Prevention    Complete by:  As directed   Drink plenty of fluids.  Prune juice may be helpful.  You may use a stool softener, such as Colace (over the counter) 100 mg twice a day.  Use MiraLax (over the counter) for constipation as needed.     Diet - low sodium heart healthy    Complete by:  As directed      Increase activity slowly as tolerated    Complete by:  As directed      Non weight bearing    Complete by:  As directed   Laterality:  left  Extremity:  Both            Medication List    STOP taking these medications        ibuprofen 800 MG tablet  Commonly known as:   ADVIL,MOTRIN     oxyCODONE-acetaminophen 5-325 MG tablet  Commonly known as:  PERCOCET/ROXICET      TAKE these medications        acetaminophen 325 MG tablet  Commonly known as:  TYLENOL  Take 2 tablets (650 mg total) by mouth every 6 (six) hours as needed for mild pain (or Fever >/= 101).     docusate sodium 100 MG capsule  Commonly known as:  COLACE  Take 1 capsule (100 mg total) by mouth 2 (two) times daily.     enoxaparin 40 MG/0.4ML injection  Commonly known as:  LOVENOX  Inject 0.4 mLs (40 mg total) into the skin daily.     ondansetron 4 MG disintegrating tablet  Commonly known as:  ZOFRAN ODT  Take 1 tablet (4 mg total) by mouth every 8 (eight) hours as needed for nausea.     oxyCODONE 5 MG immediate release tablet  Commonly known as:  Oxy IR/ROXICODONE  Take 1-2 tablets (5-10 mg total) by mouth every 3 (three) hours as needed for breakthrough pain.     senna 8.6 MG Tabs tablet  Commonly known as:  SENOKOT  Take 1 tablet (8.6 mg total)  by mouth 2 (two) times daily.     tiZANidine 4 MG tablet  Commonly known as:  ZANAFLEX  Take 8 mg by mouth 3 (three) times daily as needed for muscle spasms.           Follow-up Information    Follow up with HEWITT, Jonny Ruiz, MD. Schedule an appointment as soon as possible for a visit in 2 weeks.   Specialty:  Orthopedic Surgery   Contact information:   [address] [place] [address] [phone]       Signed: Toni Arthurs 09/06/2015, 7:45 AM

## 2015-09-06 NOTE — [place] FL2 (Signed)
[place] MEDICAID FL2 LEVEL OF CARE SCREENING TOOL     IDENTIFICATION  Patient Name: Evelyn Kline Birthdate: Mar 08, 1971 Sex: female Admission Date (Current Location): 09/04/2015  [place] and [place] Number: Haynes Bast [id number] O Facility and Address:  [facility] [place], [address], [place], [place] [address]      Provider Number: [id number]  Attending Physician Name and Address:  Toni Arthurs, MD  Relative Name and Phone Number:       Current Level of Care: Hospital Recommended Level of Care: Skilled Nursing Facility Prior Approval Number:    Date Approved/Denied:   PASRR Number: [id number]  Discharge Plan: SNF    Current Diagnoses: Patient Active Problem List   Diagnosis Date Noted  . Bimalleolar fracture 09/04/2015    Orientation ACTIVITIES/SOCIAL BLADDER RESPIRATION    Self, Time, Situation, Place  Family supportive, Active Continent Normal  BEHAVIORAL SYMPTOMS/MOOD NEUROLOGICAL BOWEL NUTRITION STATUS  Other (Comment) (n/a)   Continent    PHYSICIAN VISITS COMMUNICATION OF NEEDS Height & Weight Skin    Verbally 5\' 6"  (167.6 cm) 166 lbs. Surgical wounds          AMBULATORY STATUS RESPIRATION    Assist extensive Normal      Personal Care Assistance Level of Assistance  Bathing, Dressing Bathing Assistance: Limited assistance   Dressing Assistance: Limited assistance      Functional Limitations Info   (n/a)             SPECIAL CARE FACTORS FREQUENCY  PT (By licensed PT), OT (By licensed OT)     PT Frequency: 5 OT Frequency: 5           Additional Factors Info  Code Status, Allergies Code Status Info: FULL Allergies Info: No known allergies.           Current Medications (09/06/2015): Current Facility-Administered Medications  Medication Dose Route Frequency Provider Last Rate Last Dose  . 0.9 %  sodium chloride infusion   Intravenous Continuous Florentina Addison [place], [place] 10 mL/hr at 09/05/15 2000    .  acetaminophen (TYLENOL) tablet 650 mg  650 mg Oral Q6H PRN Jacinta Shoe, PA-C   650 mg at 09/04/15 2345   Or  . acetaminophen (TYLENOL) suppository 650 mg  650 mg Rectal Q6H PRN Jacinta Shoe, PA-C      . diphenhydrAMINE (BENADRYL) 12.5 MG/5ML elixir 12.5-25 mg  12.5-25 mg Oral Q4H PRN Jacinta Shoe, PA-C      . docusate sodium (COLACE) capsule 100 mg  100 mg Oral BID Florentina Addison [place], PA-C   100 mg at 09/06/15 0859  . enoxaparin (LOVENOX) injection 40 mg  40 mg Subcutaneous Q24H Florentina Addison [place], PA-C   40 mg at 09/06/15 0859  . metoCLOPramide (REGLAN) tablet 5-10 mg  5-10 mg Oral Q8H PRN Jacinta Shoe, PA-C       Or  . metoCLOPramide (REGLAN) injection 5-10 mg  5-10 mg Intravenous Q8H PRN Jacinta Shoe, [place]      . morphine 2 MG/ML injection 2 mg  2 mg Intravenous Q2H PRN Jacinta Shoe, PA-C   2 mg at 09/06/15 1134  . ondansetron (ZOFRAN) tablet 4 mg  4 mg Oral Q6H PRN Jacinta Shoe, PA-C       Or  . ondansetron [facility]) injection 4 mg  4 mg Intravenous Q6H PRN Jacinta Shoe, PA-C      . oxyCODONE (Oxy IR/ROXICODONE) immediate release tablet 5-10 mg  5-10 mg Oral  Q3H PRN Florentina Addison [place], PA-C   10 mg at 09/06/15 1043  . senna (SENOKOT) tablet 8.6 mg  1 tablet Oral BID Florentina Addison [place], PA-C   8.6 mg at 09/06/15 1610   Do not use this list as official medication orders. Please verify with discharge summary.  Discharge Medications:   Medication List    STOP taking these medications        ibuprofen 800 MG tablet  Commonly known as:  ADVIL,MOTRIN     oxyCODONE-acetaminophen 5-325 MG tablet  Commonly known as:  PERCOCET/ROXICET      TAKE these medications        acetaminophen 325 MG tablet  Commonly known as:  TYLENOL  Take 2 tablets (650 mg total) by mouth every 6 (six) hours as needed for mild pain (or Fever >/= 101).     docusate sodium 100 MG capsule  Commonly known as:  COLACE  Take 1 capsule (100 mg total) by mouth 2 (two) times daily.      enoxaparin 40 MG/0.4ML injection  Commonly known as:  LOVENOX  Inject 0.4 mLs (40 mg total) into the skin daily.     ondansetron 4 MG disintegrating tablet  Commonly known as:  ZOFRAN ODT  Take 1 tablet (4 mg total) by mouth every 8 (eight) hours as needed for nausea.     oxyCODONE 5 MG immediate release tablet  Commonly known as:  Oxy IR/ROXICODONE  Take 1-2 tablets (5-10 mg total) by mouth every 3 (three) hours as needed for breakthrough pain.     senna 8.6 MG Tabs tablet  Commonly known as:  SENOKOT  Take 1 tablet (8.6 mg total) by mouth 2 (two) times daily.     tiZANidine 4 MG tablet  Commonly known as:  ZANAFLEX  Take 8 mg by mouth 3 (three) times daily as needed for muscle spasms.        Relevant Imaging Results:  Relevant Lab Results:  Recent Labs    Additional Information    Rod Mae, [place]

## 2015-09-06 NOTE — Progress Notes (Signed)
Subjective: 2 Days Post-Op Procedure(s) (LRB): OPEN REDUCTION INTERNAL FIXATION (ORIF) LEFT BIOMALLEOLAR FRACTURE (Left) Patient reports pain as mild.  Tolerating a regular diet.  Ankle is "sore".  Objective: Vital signs in last 24 hours: Temp:  [97.5 F (36.4 C)-98.5 F (36.9 C)] 98.4 F (36.9 C) (11/02 0500) Pulse Rate:  [88-91] 91 (11/02 0500) Resp:  [18] 18 (11/02 0500) BP: (113-139)/(70-92) 116/78 mmHg (11/02 0500) SpO2:  [94 %-99 %] 94 % (11/02 0500)  Intake/Output from previous day: 11/01 0701 - 11/02 0700 In: 880 [P.O.:780; I.V.:100] Out: -  Intake/Output this shift:     Recent Labs  09/04/15 1311  HGB 15.9*    Recent Labs  09/04/15 1311  WBC 9.2  RBC 5.39*  HCT 46.5*  PLT 247   No results for input(s): NA, K, CL, CO2, BUN, CREATININE, GLUCOSE, CALCIUM in the last 72 hours. No results for input(s): LABPT, INR in the last 72 hours.  PE:  wn wd woman in nad.  L ankle and L UE splinted.  Wiggles toes and feel LT.  Assessment/Plan: 2 Days Post-Op Procedure(s) (LRB): OPEN REDUCTION INTERNAL FIXATION (ORIF) LEFT BIOMALLEOLAR FRACTURE (Left) Discharge to SNF  Evelyn Kline, [facility] 09/06/2015, 7:42 AM

## 2015-09-06 NOTE — Care Management (Signed)
Utilization review completed. Marcellus Pulliam, RN Case Manager [phone]. 

## 2015-09-07 NOTE — Clinical Social Work Placement (Signed)
   CLINICAL SOCIAL WORK PLACEMENT  NOTE  Date:  09/07/2015  Patient Details  Name: Evelyn Kline MRN: [id number] Date of Birth: 1971-09-11  Clinical Social Work is seeking post-discharge placement for this patient at the Skilled  Nursing Facility level of care (*CSW will initial, date and re-position this form in  chart as items are completed):  Yes   Patient/family provided with [facility] Clinical Social Work Department's list of facilities offering this level of care within the geographic area requested by the patient (or if unable, by the patient's family).  Yes   Patient/family informed of their freedom to choose among providers that offer the needed level of care, that participate in Medicare, Medicaid or managed care program needed by the patient, have an available bed and are willing to accept the patient.  Yes   Patient/family informed of [facility]'s ownership interest in [facility] and [facility], as well as of the fact that they are under no obligation to receive care at these facilities.  PASRR submitted to EDS on 09/06/15     PASRR number received on 09/06/15     Existing PASRR number confirmed on       FL2 transmitted to all facilities in geographic area requested by pt/family on 09/06/15     FL2 transmitted to all facilities within larger geographic area on 09/06/15     Patient informed that his/her managed care company has contracts with or will negotiate with certain facilities, including the following:        Yes   Patient/family informed of bed offers received.  Patient [facility] bed at [facility]     Physician recommends and patient chooses bed at      Patient to be transferred to [facility] on 09/07/15.  Patient to be transferred to facility by [facility]     Patient family notified on 09/07/15 of transfer.  Name of family member notified:  Patient at bedside.     PHYSICIAN       Additional Comment:     _______________________________________________ Rod Mae, LCSW 09/07/2015, 1:44 PM [phone]

## 2015-09-07 NOTE — Discharge Planning (Signed)
Patient to be discharged to [facility].  Facility: [facility] RN report number: [id number] Transportation: EMS  Marcelline Deist, [place] - [phone] Clinical Social Work Department Orthopedics [phone]) and Surgical [phone])

## 2015-09-07 NOTE — Progress Notes (Signed)
Called report to [facility]. Gave report to RN

## 2016-07-15 ENCOUNTER — Emergency Department (HOSPITAL_COMMUNITY): Payer: Medicaid Other

## 2016-07-15 ENCOUNTER — Emergency Department (HOSPITAL_COMMUNITY)
Admission: EM | Admit: 2016-07-15 | Discharge: 2016-07-15 | Disposition: A | Discharge status: Home / Self Care | Payer: Medicaid Other | Attending: Emergency Medicine | Admitting: Emergency Medicine

## 2016-07-15 ENCOUNTER — Encounter (HOSPITAL_COMMUNITY): Payer: Self-pay | Admitting: Emergency Medicine

## 2016-07-15 DIAGNOSIS — Z79899 Other long term (current) drug therapy: Secondary | ICD-10-CM | POA: Insufficient documentation

## 2016-07-15 DIAGNOSIS — R05 Cough: Secondary | ICD-10-CM | POA: Diagnosis present

## 2016-07-15 DIAGNOSIS — J069 Acute upper respiratory infection, unspecified: Secondary | ICD-10-CM | POA: Diagnosis not present

## 2016-07-15 MED ORDER — ALBUTEROL SULFATE HFA 108 (90 BASE) MCG/ACT IN AERS: 1.0000 | INHALATION_SPRAY | Freq: Four times a day (QID) | RESPIRATORY_TRACT | 0 refills | Status: DC | PRN

## 2016-07-15 MED ORDER — IBUPROFEN 200 MG PO TABS
600.0000 mg | ORAL_TABLET | Freq: Once | ORAL | Status: AC
  Administered 2016-07-15: 600 mg via ORAL
  Filled 2016-07-15: qty 3

## 2016-07-15 MED ORDER — BENZONATATE 100 MG PO CAPS: 100.0000 mg | ORAL_CAPSULE | Freq: Three times a day (TID) | ORAL | 0 refills | Status: DC

## 2016-07-15 NOTE — ED Triage Notes (Signed)
Pt c/o wheezing and cough x 2 days. Pt c/o pain in back constantly. Also c/o pain in chest only when coughing. Denies N/v/d, fevers. A&Ox4 and ambulatory. Lungs sound clear after breathing treatment.

## 2016-07-15 NOTE — ED Provider Notes (Signed)
[facility]-EMERGENCY DEPT Provider Note   CSN: [id number] Arrival date & time: 07/15/16  1729  By signing my name below, I, Evelyn Kline, attest that this documentation has been prepared under the direction and in the presence of [employer], PA-C. Electronically Signed: Phillis Kline, ED Scribe. 07/15/16. 7:05 PM.  History   Chief Complaint Chief Complaint  Patient presents with  . Wheezing  . Cough   The history is provided by the patient. No language interpreter was used.   HPI Comments: Evelyn Kline is a 45 y.o. female who presents to the Emergency Department complaining of gradually worsening dry cough onset 1 day ago. She reports associated sharp chest pain with coughing, congestion, wheezing, intermittent sore throat that worsens with swallowing, and constant back pain. She reports that chest pain occurs only when coughing. Pt denies chest pain currently, but states she has pain of the upper back. Pt received one albuterol treatment en route to relief, which helped her symptoms. Pt did not take anything for her symptoms prior to EMS arrival. Pt has a sick child at home. She denies hx of heart disease, hx of blood clots, hx of asthma, recent long travel or surgery, hx of smoking, hormone replacement or estrogen therapy, LE edema/pain,  fever, chills, otalgia, nausea, vomiting, or diarrhea.   Past Medical History:  Diagnosis Date  . Anemia   . Bowel obstruction (HCC) 2012  . Depression   . History of blood transfusion     Patient Active Problem List   Diagnosis Date Noted  . Bimalleolar fracture 09/04/2015    Past Surgical History:  Procedure Laterality Date  . ABDOMINAL HYSTERECTOMY    . LACERATION REPAIR Right    arm with artery involvement  . ORIF ANKLE FRACTURE Left 09/04/2015   Procedure: OPEN REDUCTION INTERNAL FIXATION (ORIF) LEFT BIOMALLEOLAR FRACTURE;  Surgeon: Toni Arthurs, MD;  Location: [facility] OR;  Service: Orthopedics;  Laterality: Left;  . SMALL INTESTINE  SURGERY  2012  . TUBAL LIGATION    . VAGINAL DELIVERY     x3, epidural anesth. with 2 of them  . WISDOM TOOTH EXTRACTION      OB History    No data available       Home Medications    Prior to Admission medications   Medication Sig Start Date End Date Taking? Authorizing Provider  acetaminophen (TYLENOL) 325 MG tablet Take 2 tablets (650 mg total) by mouth every 6 (six) hours as needed for mild pain (or Fever >/= 101). 09/06/15   Toni Arthurs, MD  docusate sodium (COLACE) 100 MG capsule Take 1 capsule (100 mg total) by mouth 2 (two) times daily. 09/06/15   Toni Arthurs, MD  enoxaparin (LOVENOX) 40 MG/0.4ML injection Inject 0.4 mLs (40 mg total) into the skin daily. 09/06/15   Toni Arthurs, MD  ondansetron (ZOFRAN ODT) 4 MG disintegrating tablet Take 1 tablet (4 mg total) by mouth every 8 (eight) hours as needed for nausea. 08/30/15   Eber Hong, MD  oxyCODONE (OXY IR/ROXICODONE) 5 MG immediate release tablet Take 1-2 tablets (5-10 mg total) by mouth every 3 (three) hours as needed for breakthrough pain. 09/06/15   Toni Arthurs, MD  senna (SENOKOT) 8.6 MG TABS tablet Take 1 tablet (8.6 mg total) by mouth 2 (two) times daily. 09/06/15   Toni Arthurs, MD  tiZANidine (ZANAFLEX) 4 MG tablet Take 8 mg by mouth 3 (three) times daily as needed for muscle spasms.  08/18/15   Historical Provider, MD  Family History Family History  Problem Relation Age of Onset  . Diabetes Father   . Diabetes Other   . Hypertension Other     Social History Social History  Substance Use Topics  . Smoking status: Never Smoker  . Smokeless tobacco: Never Used  . Alcohol use No     Allergies   Review of patient's allergies indicates no known allergies.   Review of Systems Review of Systems  Constitutional: Negative for chills and fever.  HENT: Positive for congestion and sore throat. Negative for ear pain.   Respiratory: Positive for cough, shortness of breath and wheezing.   Cardiovascular: Positive  for chest pain.  Gastrointestinal: Negative for diarrhea, nausea and vomiting.  Musculoskeletal: Positive for back pain.  A complete 10 system review of systems was obtained and all systems are negative except as noted in the HPI and PMH.   Physical Exam Updated Vital Signs BP 115/83 (BP Location: Left Arm)   Pulse 64   Temp 97.6 F (36.4 C) (Oral)   Resp 14   SpO2 100%   Physical Exam  Constitutional: She is oriented to person, place, and time. She appears well-developed and well-nourished.  HENT:  Head: Normocephalic and atraumatic.  Mouth/Throat: Oropharynx is clear and moist.  Cerumen impact to bilateral ears  Eyes: EOM are normal. Pupils are equal, round, and reactive to light.  Neck: Normal range of motion. Neck supple.  Cardiovascular: Normal rate, regular rhythm and normal heart sounds.   Pulmonary/Chest: Effort normal and breath sounds normal. No respiratory distress. She has no wheezes. She has no rales. She exhibits tenderness.  TTP of chest wall  Musculoskeletal: Normal range of motion.  Neurological: She is alert and oriented to person, place, and time.  Skin: Skin is warm and dry.  Psychiatric: She has a normal mood and affect. Her behavior is normal.  Nursing note and vitals reviewed.   ED Treatments / Results  DIAGNOSTIC STUDIES: Oxygen Saturation is 100% on RA, normal by my interpretation.    COORDINATION OF CARE: 6:59 PM-Discussed treatment plan which includes chest x-ray with pt at bedside and pt agreed to plan.   7:44 PM- Pt received motrin for her pain. She is continuing to have pain in her back, but denies chest pain. Labs (all labs ordered are listed, but only abnormal results are displayed) Labs Reviewed - No data to display  EKG  EKG Interpretation None       Radiology Dg Chest 2 View  Result Date: 07/15/2016 CLINICAL DATA:  Wheezing, cough and shortness of breath for the past 2 days. EXAM: CHEST  2 VIEW COMPARISON:  11/28/2011. FINDINGS:  Normal sized heart. Clear lungs with normal vascularity. Mild central peribronchial thickening. Normal appearing bones. IMPRESSION: Mild bronchitic changes. Electronically Signed   By: Beckie SaltsSteven  Reid M.D.   On: 07/15/2016 18:11    Procedures Procedures (including critical care time)  Medications Ordered in ED Medications - No data to display   Initial Impression / Assessment and Plan / ED Course  I have reviewed the triage vital signs and the nursing notes.  Pertinent labs & imaging results that were available during my care of the patient were reviewed by me and considered in my medical decision making (see chart for details).  Clinical Course    Final Clinical Impressions(s) / ED Diagnoses   Pt CXR with mild bronchitic changes. Patients symptoms are consistent with URI, likely viral etiology. Discussed that antibiotics are not indicated for viral infections.  She does complain of chest pain, but only with coughing.  No hypoxia or signs of respiratory distress.  She was initially slightly tachycardic most likely from Albuterol.  Tachycardia resolved during ED course.  No risk factors for PE.   Pt will be discharged with symptomatic treatment.  Verbalizes understanding and is agreeable with plan. Pt is hemodynamically stable & in NAD prior to dc.  Strict return precautions given.    Final diagnoses:  None  I personally performed the services described in this documentation, which was scribed in my presence. The recorded information has been reviewed and is accurate.   New Prescriptions New Prescriptions   No medications on file     Santiago Glad, PA-C 07/17/16 2211    Jerelyn Scott, MD 07/19/16 [phone]

## 2016-07-15 NOTE — ED Triage Notes (Signed)
Per EMS, pt c/o SOB x 2 days. Today pt c/o worsening cough that makes her chest hurt while coughing. A&Ox4 and ambulatory. Pt was wheezing en route and lung fields cleared with one albuterol treatment.

## 2016-09-26 ENCOUNTER — Encounter (HOSPITAL_COMMUNITY): Payer: Self-pay | Admitting: Emergency Medicine

## 2016-09-26 ENCOUNTER — Inpatient Hospital Stay (HOSPITAL_COMMUNITY)
Admission: EM | Admit: 2016-09-26 | Discharge: 2016-10-01 | DRG: 192 | Disposition: A | Discharge status: Home / Self Care | Payer: Medicaid Other | Attending: Internal Medicine | Admitting: Internal Medicine

## 2016-09-26 ENCOUNTER — Emergency Department (HOSPITAL_COMMUNITY): Payer: Medicaid Other

## 2016-09-26 DIAGNOSIS — R739 Hyperglycemia, unspecified: Secondary | ICD-10-CM | POA: Diagnosis present

## 2016-09-26 DIAGNOSIS — J440 Chronic obstructive pulmonary disease with acute lower respiratory infection: Principal | ICD-10-CM | POA: Diagnosis present

## 2016-09-26 DIAGNOSIS — J069 Acute upper respiratory infection, unspecified: Secondary | ICD-10-CM

## 2016-09-26 DIAGNOSIS — Z833 Family history of diabetes mellitus: Secondary | ICD-10-CM

## 2016-09-26 DIAGNOSIS — G43909 Migraine, unspecified, not intractable, without status migrainosus: Secondary | ICD-10-CM | POA: Diagnosis present

## 2016-09-26 DIAGNOSIS — J441 Chronic obstructive pulmonary disease with (acute) exacerbation: Secondary | ICD-10-CM | POA: Diagnosis present

## 2016-09-26 DIAGNOSIS — B9789 Other viral agents as the cause of diseases classified elsewhere: Secondary | ICD-10-CM

## 2016-09-26 DIAGNOSIS — J988 Other specified respiratory disorders: Secondary | ICD-10-CM | POA: Diagnosis present

## 2016-09-26 DIAGNOSIS — Z7722 Contact with and (suspected) exposure to environmental tobacco smoke (acute) (chronic): Secondary | ICD-10-CM | POA: Diagnosis present

## 2016-09-26 DIAGNOSIS — Z9851 Tubal ligation status: Secondary | ICD-10-CM

## 2016-09-26 DIAGNOSIS — K219 Gastro-esophageal reflux disease without esophagitis: Secondary | ICD-10-CM | POA: Diagnosis present

## 2016-09-26 DIAGNOSIS — J9601 Acute respiratory failure with hypoxia: Secondary | ICD-10-CM | POA: Diagnosis not present

## 2016-09-26 DIAGNOSIS — R0602 Shortness of breath: Secondary | ICD-10-CM

## 2016-09-26 DIAGNOSIS — T380X5A Adverse effect of glucocorticoids and synthetic analogues, initial encounter: Secondary | ICD-10-CM | POA: Diagnosis present

## 2016-09-26 DIAGNOSIS — J449 Chronic obstructive pulmonary disease, unspecified: Secondary | ICD-10-CM | POA: Diagnosis present

## 2016-09-26 DIAGNOSIS — R062 Wheezing: Secondary | ICD-10-CM

## 2016-09-26 DIAGNOSIS — Z90710 Acquired absence of both cervix and uterus: Secondary | ICD-10-CM

## 2016-09-26 DIAGNOSIS — J439 Emphysema, unspecified: Secondary | ICD-10-CM | POA: Diagnosis not present

## 2016-09-26 DIAGNOSIS — J209 Acute bronchitis, unspecified: Secondary | ICD-10-CM | POA: Diagnosis present

## 2016-09-26 DIAGNOSIS — Z8249 Family history of ischemic heart disease and other diseases of the circulatory system: Secondary | ICD-10-CM

## 2016-09-26 DIAGNOSIS — Z825 Family history of asthma and other chronic lower respiratory diseases: Secondary | ICD-10-CM

## 2016-09-26 LAB — BASIC METABOLIC PANEL
Anion gap: 8 (ref 5–15)
BUN: 9 mg/dL (ref 6–20)
CHLORIDE: 107 mmol/L (ref 101–111)
CO2: 22 mmol/L (ref 22–32)
CREATININE: 0.72 mg/dL (ref 0.44–1.00)
Calcium: 9.1 mg/dL (ref 8.9–10.3)
GFR calc Af Amer: >60 mL/min (ref >60)
GFR calc non Af Amer: >60 mL/min (ref >60)
Glucose, Bld: 156 mg/dL — ABNORMAL HIGH (ref 65–99)
POTASSIUM: 4.9 mmol/L (ref 3.5–5.1)
Sodium: 137 mmol/L (ref 135–145)

## 2016-09-26 LAB — CBC WITH DIFFERENTIAL/PLATELET
Basophils Absolute: 0 10*3/uL (ref 0.0–0.1)
Basophils Relative: 0 %
Eosinophils Absolute: 0.2 10*3/uL (ref 0.0–0.7)
Eosinophils Relative: 3 %
HEMATOCRIT: 46.9 % — AB (ref 36.0–46.0)
HEMOGLOBIN: 16.8 g/dL — AB (ref 12.0–15.0)
LYMPHS ABS: 1.2 10*3/uL (ref 0.7–4.0)
LYMPHS PCT: 15 %
MCH: 29.4 pg (ref 26.0–34.0)
MCHC: 35.8 g/dL (ref 30.0–36.0)
MCV: 82.1 fL (ref 78.0–100.0)
MONOS PCT: 5 %
Monocytes Absolute: 0.4 10*3/uL (ref 0.1–1.0)
NEUTROS ABS: 6.2 10*3/uL (ref 1.7–7.7)
NEUTROS PCT: 77 %
Platelets: 233 10*3/uL (ref 150–400)
RBC: 5.71 MIL/uL — ABNORMAL HIGH (ref 3.87–5.11)
RDW: 13 % (ref 11.5–15.5)
WBC: 8 10*3/uL (ref 4.0–10.5)

## 2016-09-26 LAB — I-STAT TROPONIN, ED
TROPONIN I, POC: 0 ng/mL (ref 0.00–0.08)
Troponin i, poc: 0 ng/mL (ref 0.00–0.08)
Troponin i, poc: 0 ng/mL (ref 0.00–0.08)

## 2016-09-26 LAB — D-DIMER, QUANTITATIVE: D-Dimer, Quant: 0.78 ug/mL-FEU — ABNORMAL HIGH (ref 0.00–0.50)

## 2016-09-26 MED ORDER — SODIUM CHLORIDE 0.9 % IV SOLN
INTRAVENOUS | Status: DC
  Administered 2016-09-26 – 2016-09-30 (×4): via INTRAVENOUS

## 2016-09-26 MED ORDER — ACETAMINOPHEN 325 MG PO TABS
650.0000 mg | ORAL_TABLET | Freq: Four times a day (QID) | ORAL | Status: DC | PRN
  Administered 2016-09-26 – 2016-09-29 (×4): 650 mg via ORAL
  Filled 2016-09-26 (×4): qty 2

## 2016-09-26 MED ORDER — METHYLPREDNISOLONE SODIUM SUCC 125 MG IJ SOLR
125.0000 mg | Freq: Once | INTRAMUSCULAR | Status: AC
  Administered 2016-09-26: 125 mg via INTRAVENOUS
  Filled 2016-09-26: qty 2

## 2016-09-26 MED ORDER — DOXYCYCLINE HYCLATE 100 MG IV SOLR
100.0000 mg | Freq: Once | INTRAVENOUS | Status: AC
  Administered 2016-09-26: 100 mg via INTRAVENOUS
  Filled 2016-09-26: qty 100

## 2016-09-26 MED ORDER — IPRATROPIUM-ALBUTEROL 0.5-2.5 (3) MG/3ML IN SOLN
3.0000 mL | Freq: Once | RESPIRATORY_TRACT | Status: AC
  Administered 2016-09-26: 3 mL via RESPIRATORY_TRACT
  Filled 2016-09-26: qty 3

## 2016-09-26 MED ORDER — INFLUENZA VAC SPLIT QUAD 0.5 ML IM SUSY
0.5000 mL | PREFILLED_SYRINGE | INTRAMUSCULAR | Status: AC
  Administered 2016-09-28: 0.5 mL via INTRAMUSCULAR
  Filled 2016-09-26: qty 0.5

## 2016-09-26 MED ORDER — IPRATROPIUM-ALBUTEROL 0.5-2.5 (3) MG/3ML IN SOLN
3.0000 mL | Freq: Four times a day (QID) | RESPIRATORY_TRACT | Status: DC
  Administered 2016-09-27 – 2016-10-01 (×14): 3 mL via RESPIRATORY_TRACT
  Filled 2016-09-26 (×15): qty 3

## 2016-09-26 MED ORDER — ALBUTEROL SULFATE (2.5 MG/3ML) 0.083% IN NEBU: 2.5000 mg | INHALATION_SOLUTION | RESPIRATORY_TRACT | Status: DC | PRN

## 2016-09-26 MED ORDER — KETOROLAC TROMETHAMINE 15 MG/ML IJ SOLN
15.0000 mg | Freq: Once | INTRAMUSCULAR | Status: AC
  Administered 2016-09-26: 15 mg via INTRAVENOUS
  Filled 2016-09-26: qty 1

## 2016-09-26 MED ORDER — SODIUM CHLORIDE 0.9% FLUSH
3.0000 mL | Freq: Two times a day (BID) | INTRAVENOUS | Status: DC
  Administered 2016-09-26 – 2016-10-01 (×4): 3 mL via INTRAVENOUS

## 2016-09-26 MED ORDER — METHYLPREDNISOLONE SODIUM SUCC 125 MG IJ SOLR
60.0000 mg | Freq: Four times a day (QID) | INTRAMUSCULAR | Status: DC
  Administered 2016-09-26 – 2016-09-30 (×15): 60 mg via INTRAVENOUS
  Filled 2016-09-26 (×15): qty 2

## 2016-09-26 MED ORDER — IOPAMIDOL (ISOVUE-370) INJECTION 76%
100.0000 mL | Freq: Once | INTRAVENOUS | Status: AC | PRN
  Administered 2016-09-26: 77 mL via INTRAVENOUS

## 2016-09-26 MED ORDER — ONDANSETRON HCL 4 MG PO TABS: 4.0000 mg | ORAL_TABLET | Freq: Four times a day (QID) | ORAL | Status: DC | PRN

## 2016-09-26 MED ORDER — SODIUM CHLORIDE 0.9 % IV BOLUS (SEPSIS)
1000.0000 mL | Freq: Once | INTRAVENOUS | Status: AC
  Administered 2016-09-26: 1000 mL via INTRAVENOUS

## 2016-09-26 MED ORDER — SODIUM CHLORIDE 0.9 % IJ SOLN
INTRAMUSCULAR | Status: AC
  Filled 2016-09-26: qty 50

## 2016-09-26 MED ORDER — IOPAMIDOL (ISOVUE-370) INJECTION 76%
INTRAVENOUS | Status: AC
  Filled 2016-09-26: qty 100

## 2016-09-26 MED ORDER — IPRATROPIUM-ALBUTEROL 0.5-2.5 (3) MG/3ML IN SOLN
3.0000 mL | RESPIRATORY_TRACT | Status: DC | PRN
  Administered 2016-09-27 (×2): 3 mL via RESPIRATORY_TRACT
  Filled 2016-09-26 (×2): qty 3

## 2016-09-26 MED ORDER — GUAIFENESIN ER 600 MG PO TB12
600.0000 mg | ORAL_TABLET | Freq: Two times a day (BID) | ORAL | Status: DC | PRN
  Administered 2016-09-26 – 2016-09-29 (×3): 600 mg via ORAL
  Filled 2016-09-26 (×3): qty 1

## 2016-09-26 MED ORDER — IPRATROPIUM-ALBUTEROL 0.5-2.5 (3) MG/3ML IN SOLN
3.0000 mL | RESPIRATORY_TRACT | Status: AC
  Administered 2016-09-26 (×2): 3 mL via RESPIRATORY_TRACT
  Filled 2016-09-26: qty 6

## 2016-09-26 MED ORDER — ACETAMINOPHEN 650 MG RE SUPP: 650.0000 mg | Freq: Four times a day (QID) | RECTAL | Status: DC | PRN

## 2016-09-26 MED ORDER — ONDANSETRON HCL 4 MG/2ML IJ SOLN
4.0000 mg | Freq: Four times a day (QID) | INTRAMUSCULAR | Status: DC | PRN
  Administered 2016-09-28: 4 mg via INTRAVENOUS
  Filled 2016-09-26: qty 2

## 2016-09-26 MED ORDER — IPRATROPIUM-ALBUTEROL 0.5-2.5 (3) MG/3ML IN SOLN: 3.0000 mL | Freq: Once | RESPIRATORY_TRACT | Status: DC

## 2016-09-26 MED ORDER — MAGNESIUM SULFATE 2 GM/50ML IV SOLN
2.0000 g | Freq: Once | INTRAVENOUS | Status: AC
  Administered 2016-09-26: 2 g via INTRAVENOUS
  Filled 2016-09-26: qty 50

## 2016-09-26 MED ORDER — MORPHINE SULFATE (PF) 4 MG/ML IV SOLN
4.0000 mg | Freq: Once | INTRAVENOUS | Status: AC
  Administered 2016-09-26: 4 mg via INTRAVENOUS
  Filled 2016-09-26: qty 1

## 2016-09-26 MED ORDER — ASPIRIN 81 MG PO CHEW
324.0000 mg | CHEWABLE_TABLET | Freq: Once | ORAL | Status: AC
  Administered 2016-09-26: 324 mg via ORAL
  Filled 2016-09-26: qty 4

## 2016-09-26 MED ORDER — IPRATROPIUM-ALBUTEROL 0.5-2.5 (3) MG/3ML IN SOLN
3.0000 mL | RESPIRATORY_TRACT | Status: DC
  Administered 2016-09-26: 3 mL via RESPIRATORY_TRACT
  Filled 2016-09-26: qty 3

## 2016-09-26 MED ORDER — DOXYCYCLINE HYCLATE 100 MG PO TABS
100.0000 mg | ORAL_TABLET | Freq: Two times a day (BID) | ORAL | Status: DC
  Administered 2016-09-27 – 2016-10-01 (×9): 100 mg via ORAL
  Filled 2016-09-26 (×9): qty 1

## 2016-09-26 NOTE — ED Notes (Signed)
Patient partner, Tresa EndoKelly [phone]), requesting update from hospitalist. Hospitalist made aware.

## 2016-09-26 NOTE — ED Notes (Signed)
Patient transported to CT 

## 2016-09-26 NOTE — ED Triage Notes (Signed)
Per EMS, from home c/o chest tightness and nonproductive cough x2 days. Wheezing bilaterally. 5mg  albuterol with EMS.

## 2016-09-26 NOTE — ED Notes (Signed)
Ambulated pt in hall, pt c/o dizziness and weakness

## 2016-09-26 NOTE — ED Notes (Signed)
Respiratory called for breathing treatment.

## 2016-09-26 NOTE — ED Notes (Signed)
Hospitalist at bedside 

## 2016-09-26 NOTE — H&P (Signed)
History and Physical    Evelyn Kline:[id number] DOB: Mar 15, 1971 DOA: 09/26/2016  PCP: Jackie Plum, MD   Patient coming from: Home  Chief Complaint: Cough, shortness of breath, atypical chest pain  HPI: Evelyn Kline is a 46 y.o. woman with a history of depression and anemia who presents for evaluation of 2-3 days of cough and congestion.  She has had subjective fevers.  Cough has been non-productive.  She has had progressive shortness of breath with increased wheezing.  She has also had a pleuritic type chest pain (worse with deep inspiration or cough) that radiates to her back.  She has had bronchitis before, but she feels that her symptoms have never been this bad or progressed this rapidly.  She denies any known history of asthma.  She has never required supplemental oxygen.  ED Course: She was brought in by EMS personnel.  She required multiple nebulizer treatments en route as well as in the ED.  She was found to have borderline O2 sats upon arrival, 90% on RA.  She has shown marginal improvement after IV solumedrol, empiric doxycycline, and magnesium.  Negative troponin.  Mildly elevated D-dimer.  CTA chest negative for acute PE, but there is an unexpected finding of emphysematous changes. Hospitalist asked to admit for further management.  She does not have a personal history of tobacco use.  She has been exposed to second hand smoke in the home for the past 5 years because her significant other smokes.  She denies unusual exposures.  Review of Systems: As per HPI otherwise 10 point review of systems negative.    Past Medical History:  Diagnosis Date  . Anemia   . Bowel obstruction 2012  . Depression   . History of blood transfusion     Past Surgical History:  Procedure Laterality Date  . ABDOMINAL HYSTERECTOMY    . LACERATION REPAIR Right    arm with artery involvement  . ORIF ANKLE FRACTURE Left 09/04/2015   Procedure: OPEN REDUCTION INTERNAL FIXATION (ORIF)  LEFT BIOMALLEOLAR FRACTURE;  Surgeon: Toni Arthurs, MD;  Location: [facility] OR;  Service: Orthopedics;  Laterality: Left;  . SMALL INTESTINE SURGERY  2012  . TUBAL LIGATION    . VAGINAL DELIVERY     x3, epidural anesth. with 2 of them  . WISDOM TOOTH EXTRACTION       reports that she has never smoked. She has never used smokeless tobacco. She reports that she does not drink alcohol or use drugs. No second hand smoke exposure in the home as a child.  Second hand smoke exposure for the past five years.    No Known Allergies  Family History  Problem Relation Age of Onset  . Diabetes Father   . Diabetes Other   . Hypertension Other   One maternal aunt and one maternal great aunt died of complications related to COPD.  Both were smokers.  Her maternal grandparents had heart disease.   Prior to Admission medications   Medication Sig Start Date End Date Taking? Authorizing Provider  albuterol (PROVENTIL HFA;VENTOLIN HFA) 108 (90 Base) MCG/ACT inhaler Inhale 1-2 puffs into the lungs every 6 (six) hours as needed for wheezing or shortness of breath. 07/15/16  Yes Heather Laisure, PA-C  guaiFENesin (MUCINEX) 600 MG 12 hr tablet Take 600 mg by mouth 2 (two) times daily as needed for cough or to loosen phlegm.   Yes Historical Provider, MD    Physical Exam: Vitals:   09/26/16 1736 09/26/16 1811 09/26/16 1929  09/26/16 2030  BP:  119/73 120/86 129/85  Pulse: 86 88 89 92  Resp: 26 (!) 30 (!) 27 24  SpO2: 93% 93% 95% 94%  Weight:      Height:          Constitutional: NAD but ill appearing Vitals:   09/26/16 1736 09/26/16 1811 09/26/16 1929 09/26/16 2030  BP:  119/73 120/86 129/85  Pulse: 86 88 89 92  Resp: 26 (!) 30 (!) 27 24  SpO2: 93% 93% 95% 94%  Weight:      Height:       Eyes: PERRL, lids and conjunctivae normal ENMT: Mucous membranes are slightly dry. Posterior pharynx clear of any exudate or lesions. Normal dentition.  Neck: normal appearance, supple Respiratory: Bilateral  wheezing but no significant ronchi.  No crackles.  Breathing is labored.  No accessory muscle use.  Cardiovascular: Normal rate, regular rhythm, no murmurs / rubs / gallops. No extremity edema. 2+ pedal pulses. GI: abdomen is soft and compressible.  No distention.  No tenderness.  Bowel sounds are present. Musculoskeletal:  No joint deformity in upper and lower extremities. Good ROM, no contractures. Normal muscle tone.  Skin: no rashes, warm and dry Neurologic: No focal deficits. Psychiatric: Normal judgment and insight. Alert and oriented x 3. Normal mood.     Labs on Admission: I have personally reviewed following labs and imaging studies  CBC:  Recent Labs Lab 09/26/16 1402  WBC 8.0  NEUTROABS 6.2  HGB 16.8*  HCT 46.9*  MCV 82.1  PLT 233   Basic Metabolic Panel:  Recent Labs Lab 09/26/16 1402  NA 137  K 4.9  CL 107  CO2 22  GLUCOSE 156*  BUN 9  CREATININE 0.72  CALCIUM 9.1   GFR: Estimated Creatinine Clearance: 93.8 mL/min (by C-G formula based on SCr of 0.72 mg/dL).  Radiological Exams on Admission: Dg Chest 2 View  Result Date: 09/26/2016 CLINICAL DATA:  Congestion and productive cough for 2 days EXAM: CHEST  2 VIEW COMPARISON:  None 09/2016 FINDINGS: The heart size and mediastinal contours are within normal limits. Both lungs are clear. The visualized skeletal structures are unremarkable. IMPRESSION: No active cardiopulmonary disease. Electronically Signed   By: Jasmine Pang M.D.   On: 09/26/2016 14:26   Ct Angio Chest Pe W And/or Wo Contrast  Result Date: 09/26/2016 CLINICAL DATA:  Chest tightness with nonproductive cough and wheezing EXAM: CT ANGIOGRAPHY CHEST WITH CONTRAST TECHNIQUE: Multidetector CT imaging of the chest was performed using the standard protocol during bolus administration of intravenous contrast. Multiplanar CT image reconstructions and MIPs were obtained to evaluate the vascular anatomy. CONTRAST:  77 mL Isovue 370 intravenous  COMPARISON:  Chest x-ray 09/26/2016 FINDINGS: Cardiovascular: Satisfactory opacification of the pulmonary arteries to the segmental level. No evidence of pulmonary embolism. Normal heart size. No pericardial effusion. Mediastinum/Nodes: No enlarged mediastinal, hilar, or axillary lymph nodes. Thyroid gland and trachea are within normal limits. Mild distal esophageal thickening could relate to a small hiatal hernia. Lungs/Pleura: Moderate paraseptal and centrilobular emphysema within the bilateral upper and lower lobes. No consolidation or effusion. No pneumothorax. Upper Abdomen: No acute abnormality. Mild nodular thickening of the adrenal glands but without discrete nodule. Musculoskeletal: No chest wall abnormality. No acute or significant osseous findings. Review of the MIP images confirms the above findings. IMPRESSION: No CT evidence for acute pulmonary embolus. Moderate emphysematous disease of the lungs. Electronically Signed   By: Jasmine Pang M.D.   On: 09/26/2016 16:47  EKG: Independently reviewed. Sinus tachycardia.  Assessment/Plan Principal Problem:   COPD (chronic obstructive pulmonary disease) with emphysema (HCC) Active Problems:   Wheezing-associated respiratory infection (WARI)      Acute bronchitis with acute hypoxic respiratory failure and incidental finding of emphysematous changes on CT.  No personal history of tobacco use. --Place in observation to continue IV steroids, duoneb nebulizer treatment, respiratory therapy, and empiric doxycycline. --Continuous pulse oximetry --Wean oxygen as tolerated --Document ambulatory O2 sat in the AM --She will need pulmonary referral for ancillary work-up (PFTs, alpha 1 antitrypsin deficiency screening, other causes of chronic lung disease) and recommendations for maintenance meds. --Patient's significant other advised to not smoke in the house.   DVT prophylaxis: Low risk, outpatient status Code Status: FULL Family Communication:  Spoke with partner and mother on cell phone in the ED at time of admission. Disposition Plan: To be determined. Consults called: She will need pulmonary referral (inpatient vs outpatient depending on response to therapy by morning). Admission status: Place in observation with telemetry monitoring.   TIME SPENT: 70 minutes   Jerene Bearsarter,Cosandra Plouffe Harrison MD [facility] Pager [phone]  If 7PM-7AM, please contact night-coverage [email or web address] Password [email or web address]  09/26/2016, 10:12 PM

## 2016-09-26 NOTE — ED Notes (Signed)
ED Provider at bedside. 

## 2016-09-26 NOTE — ED Notes (Signed)
Bed: WA20 Expected date:  Expected time:  Means of arrival:  Comments: EMS- 44yo F, SOB, Chest tightness

## 2016-09-26 NOTE — ED Notes (Signed)
Patient given sprite.

## 2016-09-26 NOTE — ED Notes (Addendum)
Hospitalist requesting patient be held in ED for assessment prior to going to the floor.

## 2016-09-26 NOTE — ED Provider Notes (Signed)
[facility]-EMERGENCY DEPT Provider Note   CSN: [id number] Arrival date & time: 09/26/16  1343     History   Chief Complaint Chief Complaint  Patient presents with  . Shortness of Breath  . Cough    HPI Evelyn Kline is a 45 y.o. female.  HPI 45 year old female with history of depression who presents with cough and shortness of breath. The patient states that her symptoms started yesterday as nasal congestion, cough, and shortness of breath. She awoke this morning and developed a sharp, pleuritic, left-sided chest pain along with nausea and vomiting. She didn't develop significant worsening shortness of breath and called EMS. On EMS arrival, patient had diffuse wheezing and was given albuterol with reported mild improvement. Currently, patient endorses persistent pain that is made worse with deep breaths. Pain is also worse with position changes. Denies any known fevers. She has no history of DVT or PE. She has no history of asthma or COPD but reportedly has used inhalers in the past.  Past Medical History:  Diagnosis Date  . Anemia   . Bowel obstruction 2012  . Depression   . History of blood transfusion     Patient Active Problem List   Diagnosis Date Noted  . Bimalleolar fracture 09/04/2015    Past Surgical History:  Procedure Laterality Date  . ABDOMINAL HYSTERECTOMY    . LACERATION REPAIR Right    arm with artery involvement  . ORIF ANKLE FRACTURE Left 09/04/2015   Procedure: OPEN REDUCTION INTERNAL FIXATION (ORIF) LEFT BIOMALLEOLAR FRACTURE;  Surgeon: Toni ArthursJohn Hewitt, MD;  Location: [facility] OR;  Service: Orthopedics;  Laterality: Left;  . SMALL INTESTINE SURGERY  2012  . TUBAL LIGATION    . VAGINAL DELIVERY     x3, epidural anesth. with 2 of them  . WISDOM TOOTH EXTRACTION      OB History    No data available       Home Medications    Prior to Admission medications   Medication Sig Start Date End Date Taking? Authorizing Provider  albuterol (PROVENTIL HFA;VENTOLIN  HFA) 108 (90 Base) MCG/ACT inhaler Inhale 1-2 puffs into the lungs every 6 (six) hours as needed for wheezing or shortness of breath. 07/15/16  Yes Heather Laisure, PA-C  guaiFENesin (MUCINEX) 600 MG 12 hr tablet Take 600 mg by mouth 2 (two) times daily as needed for cough or to loosen phlegm.   Yes Historical Provider, MD  acetaminophen (TYLENOL) 325 MG tablet Take 2 tablets (650 mg total) by mouth every 6 (six) hours as needed for mild pain (or Fever >/= 101). Patient not taking: Reported on 09/26/2016 09/06/15   Toni ArthursJohn Hewitt, MD  benzonatate (TESSALON) 100 MG capsule Take 1 capsule (100 mg total) by mouth every 8 (eight) hours. Patient not taking: Reported on 09/26/2016 07/15/16   Santiago GladHeather Laisure, PA-C  docusate sodium (COLACE) 100 MG capsule Take 1 capsule (100 mg total) by mouth 2 (two) times daily. Patient not taking: Reported on 09/26/2016 09/06/15   Toni ArthursJohn Hewitt, MD  senna (SENOKOT) 8.6 MG TABS tablet Take 1 tablet (8.6 mg total) by mouth 2 (two) times daily. Patient not taking: Reported on 09/26/2016 09/06/15   Toni ArthursJohn Hewitt, MD    Family History Family History  Problem Relation Age of Onset  . Diabetes Father   . Diabetes Other   . Hypertension Other     Social History Social History  Substance Use Topics  . Smoking status: Never Smoker  . Smokeless tobacco: Never Used  . Alcohol  use No     Allergies   Patient has no known allergies.   Review of Systems Review of Systems  Constitutional: Negative for chills, fatigue and fever.  HENT: Negative for congestion and rhinorrhea.   Eyes: Negative for visual disturbance.  Respiratory: Positive for cough, chest tightness, shortness of breath and wheezing.   Cardiovascular: Positive for chest pain. Negative for leg swelling.  Gastrointestinal: Negative for abdominal pain, diarrhea, nausea and vomiting.  Genitourinary: Negative for dysuria and flank pain.  Musculoskeletal: Negative for neck pain and neck stiffness.  Skin: Negative  for rash and wound.  Allergic/Immunologic: Negative for immunocompromised state.  Neurological: Negative for syncope, weakness and headaches.  All other systems reviewed and are negative.    Physical Exam Updated Vital Signs BP 125/85   Pulse 60   Resp 18   Ht 5\' 6"  (1.676 m)   Wt 165 lb (74.8 kg)   SpO2 100%   BMI 26.63 kg/m   Physical Exam  Constitutional: She is oriented to person, place, and time. She appears well-developed and well-nourished. No distress.  HENT:  Head: Normocephalic and atraumatic.  Eyes: Conjunctivae are normal.  Neck: Neck supple.  Cardiovascular: Regular rhythm and normal heart sounds.  Tachycardia present.  Exam reveals no friction rub.   No murmur heard. Pulmonary/Chest: No accessory muscle usage. Tachypnea noted. She is in respiratory distress. She has decreased breath sounds. She has wheezes in the right middle field, the right lower field, the left middle field and the left lower field. She has no rales.  Abdominal: Soft. Bowel sounds are normal. She exhibits no distension. There is no tenderness.  Musculoskeletal: She exhibits no edema.  Neurological: She is alert and oriented to person, place, and time. She exhibits normal muscle tone.  Skin: Skin is warm. Capillary refill takes less than 2 seconds.  Psychiatric: She has a normal mood and affect.  Nursing note and vitals reviewed.    ED Treatments / Results  Labs (all labs ordered are listed, but only abnormal results are displayed) Labs Reviewed  CBC WITH DIFFERENTIAL/PLATELET - Abnormal; Notable for the following:       Result Value   RBC 5.71 (*)    Hemoglobin 16.8 (*)    HCT 46.9 (*)    All other components within normal limits  BASIC METABOLIC PANEL - Abnormal; Notable for the following:    Glucose, Bld 156 (*)    All other components within normal limits  D-DIMER, QUANTITATIVE (NOT AT [facility]) - Abnormal; Notable for the following:    D-Dimer, Quant 0.78 (*)    All other components  within normal limits  I-STAT TROPOININ, ED  I-STAT TROPOININ, ED  Rosezena Sensor, ED  Rosezena Sensor, ED    EKG  EKG Interpretation  Date/Time:  Thursday September 26 2016 13:54:37 EST Ventricular Rate:  139 PR Interval:    QRS Duration: 82 QT Interval:  293 QTC Calculation: 446 R Axis:   21 Text Interpretation:  Sinus tachycardia Ventricular premature complex Aberrant complex No old tracing to compare Non-specific ST changes, without St elevations or depressions Confirmed by Erma Heritage MD, Sheria Lang [phone]) on 09/26/2016 3:11:24 PM       Radiology Dg Chest 2 View  Result Date: 09/26/2016 CLINICAL DATA:  Congestion and productive cough for 2 days EXAM: CHEST  2 VIEW COMPARISON:  None 09/2016 FINDINGS: The heart size and mediastinal contours are within normal limits. Both lungs are clear. The visualized skeletal structures are unremarkable. IMPRESSION: No active cardiopulmonary  disease. Electronically Signed   By: Jasmine Pang M.D.   On: 09/26/2016 14:26   Ct Angio Chest Pe W And/or Wo Contrast  Result Date: 09/26/2016 CLINICAL DATA:  Chest tightness with nonproductive cough and wheezing EXAM: CT ANGIOGRAPHY CHEST WITH CONTRAST TECHNIQUE: Multidetector CT imaging of the chest was performed using the standard protocol during bolus administration of intravenous contrast. Multiplanar CT image reconstructions and MIPs were obtained to evaluate the vascular anatomy. CONTRAST:  77 mL Isovue 370 intravenous COMPARISON:  Chest x-ray 09/26/2016 FINDINGS: Cardiovascular: Satisfactory opacification of the pulmonary arteries to the segmental level. No evidence of pulmonary embolism. Normal heart size. No pericardial effusion. Mediastinum/Nodes: No enlarged mediastinal, hilar, or axillary lymph nodes. Thyroid gland and trachea are within normal limits. Mild distal esophageal thickening could relate to a small hiatal hernia. Lungs/Pleura: Moderate paraseptal and centrilobular emphysema within the  bilateral upper and lower lobes. No consolidation or effusion. No pneumothorax. Upper Abdomen: No acute abnormality. Mild nodular thickening of the adrenal glands but without discrete nodule. Musculoskeletal: No chest wall abnormality. No acute or significant osseous findings. Review of the MIP images confirms the above findings. IMPRESSION: No CT evidence for acute pulmonary embolus. Moderate emphysematous disease of the lungs. Electronically Signed   By: Jasmine Pang M.D.   On: 09/26/2016 16:47    Procedures Procedures (including critical care time)  Medications Ordered in ED Medications  sodium chloride 0.9 % injection (not administered)  iopamidol (ISOVUE-370) 76 % injection (not administered)  sodium chloride 0.9 % bolus 1,000 mL (1,000 mLs Intravenous New Bag/Given 09/26/16 1906)  doxycycline (VIBRAMYCIN) 100 mg in dextrose 5 % 250 mL IVPB (100 mg Intravenous New Bag/Given 09/26/16 1928)  sodium chloride 0.9 % bolus 1,000 mL (0 mLs Intravenous Stopped 09/26/16 1508)  ipratropium-albuterol (DUONEB) 0.5-2.5 (3) MG/3ML nebulizer solution 3 mL (3 mLs Nebulization Given 09/26/16 1456)  morphine 4 MG/ML injection 4 mg (4 mg Intravenous Given 09/26/16 1501)  aspirin chewable tablet 324 mg (324 mg Oral Given 09/26/16 1504)  methylPREDNISolone sodium succinate (SOLU-MEDROL) 125 mg/2 mL injection 125 mg (125 mg Intravenous Given 09/26/16 1501)  magnesium sulfate IVPB 2 g 50 mL (0 g Intravenous Stopped 09/26/16 1655)  iopamidol (ISOVUE-370) 76 % injection 100 mL (77 mLs Intravenous Contrast Given 09/26/16 1620)  ipratropium-albuterol (DUONEB) 0.5-2.5 (3) MG/3ML nebulizer solution 3 mL (3 mLs Nebulization Given 09/26/16 1736)  ketorolac (TORADOL) 15 MG/ML injection 15 mg (15 mg Intravenous Given 09/26/16 1713)     Initial Impression / Assessment and Plan / ED Course  I have reviewed the triage vital signs and the nursing notes.  Pertinent labs & imaging results that were available during my  care of the patient were reviewed by me and considered in my medical decision making (see chart for details).  Clinical Course     45 yo F with PMHx as above who p/w severe SOB, tachypnea, and diffuse wheezing with sharp, pleuritic CP. On arrival, pt satting 90-91% on RA with exam as above. Bp stable. Exam shows diffuse, severe wheezing with diminished aeration. Will continue breathing tx, check CXR, and labs including D-Dimer to assess for PE. EKG non-ischemic, less likely ACS. Trop is pending. Otherwise, pt denies h/o COPD/asthma but has reportedly used inhaler before. Non-smoker. Question of underlying RAD.  Patient persistently tachypneic after IVF, solumedrol, breathing tx. CXR clear. D-Dimer positive so CTA PE obtained and is negative for PE.  Following treatment as above, pt increasingly tachypneic with ambulation and endorses severe lightheadedness, SOB. She does  not feel comfortable going home. Given persistent wheezing, SOB, tachypnea, and difficulty ambulating, will admit for likely viral URI. Of note, pt has emphysema on CXR - likely COPD/emphsema exacerbation. She will need w/u for this as she denies h/o smoking.  Final Clinical Impressions(s) / ED Diagnoses   Final diagnoses:  Viral URI with cough  Wheezing      Shaune Pollack, MD 09/26/16 1930

## 2016-09-27 DIAGNOSIS — Z9851 Tubal ligation status: Secondary | ICD-10-CM | POA: Diagnosis not present

## 2016-09-27 DIAGNOSIS — K219 Gastro-esophageal reflux disease without esophagitis: Secondary | ICD-10-CM | POA: Diagnosis present

## 2016-09-27 DIAGNOSIS — Z7722 Contact with and (suspected) exposure to environmental tobacco smoke (acute) (chronic): Secondary | ICD-10-CM | POA: Diagnosis present

## 2016-09-27 DIAGNOSIS — J439 Emphysema, unspecified: Secondary | ICD-10-CM | POA: Diagnosis not present

## 2016-09-27 DIAGNOSIS — Z825 Family history of asthma and other chronic lower respiratory diseases: Secondary | ICD-10-CM | POA: Diagnosis not present

## 2016-09-27 DIAGNOSIS — J449 Chronic obstructive pulmonary disease, unspecified: Secondary | ICD-10-CM | POA: Diagnosis present

## 2016-09-27 DIAGNOSIS — Z833 Family history of diabetes mellitus: Secondary | ICD-10-CM | POA: Diagnosis not present

## 2016-09-27 DIAGNOSIS — G43709 Chronic migraine without aura, not intractable, without status migrainosus: Secondary | ICD-10-CM

## 2016-09-27 DIAGNOSIS — R739 Hyperglycemia, unspecified: Secondary | ICD-10-CM

## 2016-09-27 DIAGNOSIS — J441 Chronic obstructive pulmonary disease with (acute) exacerbation: Secondary | ICD-10-CM | POA: Diagnosis present

## 2016-09-27 DIAGNOSIS — Z8249 Family history of ischemic heart disease and other diseases of the circulatory system: Secondary | ICD-10-CM | POA: Diagnosis not present

## 2016-09-27 DIAGNOSIS — J988 Other specified respiratory disorders: Secondary | ICD-10-CM | POA: Diagnosis present

## 2016-09-27 DIAGNOSIS — J209 Acute bronchitis, unspecified: Secondary | ICD-10-CM | POA: Diagnosis present

## 2016-09-27 DIAGNOSIS — T380X5A Adverse effect of glucocorticoids and synthetic analogues, initial encounter: Secondary | ICD-10-CM | POA: Diagnosis present

## 2016-09-27 DIAGNOSIS — G43909 Migraine, unspecified, not intractable, without status migrainosus: Secondary | ICD-10-CM | POA: Diagnosis present

## 2016-09-27 DIAGNOSIS — R062 Wheezing: Secondary | ICD-10-CM | POA: Diagnosis present

## 2016-09-27 DIAGNOSIS — J9601 Acute respiratory failure with hypoxia: Secondary | ICD-10-CM

## 2016-09-27 DIAGNOSIS — Z90710 Acquired absence of both cervix and uterus: Secondary | ICD-10-CM | POA: Diagnosis not present

## 2016-09-27 DIAGNOSIS — J440 Chronic obstructive pulmonary disease with acute lower respiratory infection: Secondary | ICD-10-CM | POA: Diagnosis present

## 2016-09-27 LAB — CBC
HEMATOCRIT: 41.1 % (ref 36.0–46.0)
Hemoglobin: 14.1 g/dL (ref 12.0–15.0)
MCH: 28.3 pg (ref 26.0–34.0)
MCHC: 34.3 g/dL (ref 30.0–36.0)
MCV: 82.4 fL (ref 78.0–100.0)
Platelets: 228 10*3/uL (ref 150–400)
RBC: 4.99 MIL/uL (ref 3.87–5.11)
RDW: 13 % (ref 11.5–15.5)
WBC: 7.8 10*3/uL (ref 4.0–10.5)

## 2016-09-27 LAB — COMPREHENSIVE METABOLIC PANEL
ALK PHOS: 119 U/L (ref 38–126)
ALT: 28 U/L (ref 14–54)
ANION GAP: 4 — AB (ref 5–15)
AST: 24 U/L (ref 15–41)
Albumin: 3.8 g/dL (ref 3.5–5.0)
BILIRUBIN TOTAL: 0.9 mg/dL (ref 0.3–1.2)
BUN: 7 mg/dL (ref 6–20)
CALCIUM: 8.9 mg/dL (ref 8.9–10.3)
CO2: 22 mmol/L (ref 22–32)
Chloride: 111 mmol/L (ref 101–111)
Creatinine, Ser: 0.59 mg/dL (ref 0.44–1.00)
GFR calc Af Amer: >60 mL/min (ref >60)
Glucose, Bld: 139 mg/dL — ABNORMAL HIGH (ref 65–99)
POTASSIUM: 4 mmol/L (ref 3.5–5.1)
Sodium: 137 mmol/L (ref 135–145)
TOTAL PROTEIN: 7.2 g/dL (ref 6.5–8.1)

## 2016-09-27 MED ORDER — VALPROATE SODIUM 500 MG/5ML IV SOLN
500.0000 mg | Freq: Once | INTRAVENOUS | Status: AC
  Administered 2016-09-27: 500 mg via INTRAVENOUS
  Filled 2016-09-27: qty 5

## 2016-09-27 MED ORDER — IBUPROFEN 200 MG PO TABS
400.0000 mg | ORAL_TABLET | Freq: Four times a day (QID) | ORAL | Status: DC | PRN
  Administered 2016-09-27 – 2016-10-01 (×8): 400 mg via ORAL
  Filled 2016-09-27 (×9): qty 2

## 2016-09-27 MED ORDER — BUDESONIDE 0.25 MG/2ML IN SUSP
0.2500 mg | Freq: Two times a day (BID) | RESPIRATORY_TRACT | Status: DC
  Administered 2016-09-27 – 2016-10-01 (×8): 0.25 mg via RESPIRATORY_TRACT
  Filled 2016-09-27 (×8): qty 2

## 2016-09-27 MED ORDER — DEXTROMETHORPHAN POLISTIREX ER 30 MG/5ML PO SUER
30.0000 mg | Freq: Four times a day (QID) | ORAL | Status: DC | PRN
  Administered 2016-09-27 – 2016-09-29 (×4): 30 mg via ORAL
  Filled 2016-09-27 (×6): qty 5

## 2016-09-27 NOTE — Progress Notes (Signed)
[facility] PROGRESS NOTE  Evelyn Kline FVC:[id number] DOB: 11-04-1971 DOA: 09/26/2016 PCP: Jackie Plum, MD  Interim summary and HPI 45 y.o. woman with a history of depression and anemia who presents for evaluation of 2-3 days of cough and congestion.  She has had subjective fevers.  Cough has been non-productive.  She has had progressive shortness of breath with increased wheezing.  She has also had a pleuritic type chest pain (worse with deep inspiration or cough) that radiates to her back.  She has had bronchitis before, but she feels that her symptoms have never been this bad or progressed this rapidly.  She denies any known history of asthma.  She has never required supplemental oxygen.  Assessment/Plan: 1-acute hypoxic resp failure due to COPD exacerbation -still SOB, with fair air movement and ongoing wheezing -will continue steroids and nebulizer treatment -will continue doxycycline and add pulmicort -weaned oxygen as tolerated and assess oxygen on ambulation -will add delsym for cough  2-migraine -due to intractable pain will give depakote 500mg  IV X1 -will continue use PRN ibuprofen  3-hyperglycemia -no prior hx of diabetes, but condition runs in her family -imagine elevated CBG's due to steroids -will check A1C; no coverage until CBG's > 180  Code Status: Full Family Communication: mother over the phone  Disposition Plan: home when medically stable and breathing stabilizes. Continue IV steroids, nebulizer treatment and abx's.   Consultants:  None   Procedures:  See below for x-ray reports   Antibiotics:  Doxycycline 11/23  HPI/Subjective: Still SOB, complaining of HA and with intermittent coughing spells   Objective: Vitals:   09/27/16 1535 09/27/16 1840  BP:    Pulse: 85 100  Resp: 18 (!) 22  Temp:      Intake/Output Summary (Last 24 hours) at 09/27/16 1908 Last data filed at 09/27/16 1700  Gross per 24 hour  Intake           2492.5  ml  Output                0 ml  Net           2492.5 ml   Filed Weights   09/26/16 1354 09/26/16 2221  Weight: 74.8 kg (165 lb) 74.7 kg (164 lb 10.9 oz)    Exam:   General:  Afebrile, complaining of bad HA and with intermittent coughing spells. Patient reports ongoing SOB  Cardiovascular: S1 and S2, no rubs or gallops  Respiratory: diffuse rhonchi, positive exp wheezing and fair air movement, not using accessory muscles   Abdomen: soft, NT, ND, positive BS  Musculoskeletal: no edema, no cyanosis   Data Reviewed: Basic Metabolic Panel:  Recent Labs Lab 09/26/16 1402 09/27/16 0325  NA 137 137  K 4.9 4.0  CL 107 111  CO2 22 22  GLUCOSE 156* 139*  BUN 9 7  CREATININE 0.72 0.59  CALCIUM 9.1 8.9   Liver Function Tests:  Recent Labs Lab 09/27/16 0325  AST 24  ALT 28  ALKPHOS 119  BILITOT 0.9  PROT 7.2  ALBUMIN 3.8   CBC:  Recent Labs Lab 09/26/16 1402 09/27/16 0325  WBC 8.0 7.8  NEUTROABS 6.2  --   HGB 16.8* 14.1  HCT 46.9* 41.1  MCV 82.1 82.4  PLT 233 228    Studies: Dg Chest 2 View  Result Date: 09/26/2016 CLINICAL DATA:  Congestion and productive cough for 2 days EXAM: CHEST  2 VIEW COMPARISON:  None 09/2016 FINDINGS: The heart size and mediastinal contours  are within normal limits. Both lungs are clear. The visualized skeletal structures are unremarkable. IMPRESSION: No active cardiopulmonary disease. Electronically Signed   By: Jasmine PangKim  Fujinaga M.D.   On: 09/26/2016 14:26   Ct Angio Chest Pe W And/or Wo Contrast  Result Date: 09/26/2016 CLINICAL DATA:  Chest tightness with nonproductive cough and wheezing EXAM: CT ANGIOGRAPHY CHEST WITH CONTRAST TECHNIQUE: Multidetector CT imaging of the chest was performed using the standard protocol during bolus administration of intravenous contrast. Multiplanar CT image reconstructions and MIPs were obtained to evaluate the vascular anatomy. CONTRAST:  77 mL Isovue 370 intravenous COMPARISON:  Chest x-ray  09/26/2016 FINDINGS: Cardiovascular: Satisfactory opacification of the pulmonary arteries to the segmental level. No evidence of pulmonary embolism. Normal heart size. No pericardial effusion. Mediastinum/Nodes: No enlarged mediastinal, hilar, or axillary lymph nodes. Thyroid gland and trachea are within normal limits. Mild distal esophageal thickening could relate to a small hiatal hernia. Lungs/Pleura: Moderate paraseptal and centrilobular emphysema within the bilateral upper and lower lobes. No consolidation or effusion. No pneumothorax. Upper Abdomen: No acute abnormality. Mild nodular thickening of the adrenal glands but without discrete nodule. Musculoskeletal: No chest wall abnormality. No acute or significant osseous findings. Review of the MIP images confirms the above findings. IMPRESSION: No CT evidence for acute pulmonary embolus. Moderate emphysematous disease of the lungs. Electronically Signed   By: Jasmine PangKim  Fujinaga M.D.   On: 09/26/2016 16:47    Scheduled Meds: . budesonide (PULMICORT) nebulizer solution  0.25 mg Nebulization BID  . doxycycline  100 mg Oral BID  . Influenza vac split quadrivalent PF  0.5 mL Intramuscular Tomorrow-1000  . ipratropium-albuterol  3 mL Nebulization QID  . methylPREDNISolone (SOLU-MEDROL) injection  60 mg Intravenous Q6H  . sodium chloride flush  3 mL Intravenous Q12H  . valproate sodium  500 mg Intravenous Once   Continuous Infusions: . sodium chloride Stopped (09/27/16 0617)    Principal Problem:   COPD (chronic obstructive pulmonary disease) with emphysema (HCC) Active Problems:   Wheezing-associated respiratory infection (WARI)   COPD (chronic obstructive pulmonary disease) (HCC)    Time spent: 25 minutes    Vassie LollMadera, Keali Mccraw  [facility] Pager [phone]. If 7PM-7AM, please contact night-coverage at [email or web address], password [email or web address] 09/27/2016, 7:08 PM  LOS: 0 days

## 2016-09-28 DIAGNOSIS — G43909 Migraine, unspecified, not intractable, without status migrainosus: Secondary | ICD-10-CM

## 2016-09-28 LAB — CBC
HCT: 39.9 % (ref 36.0–46.0)
Hemoglobin: 14 g/dL (ref 12.0–15.0)
MCH: 28.9 pg (ref 26.0–34.0)
MCHC: 35.1 g/dL (ref 30.0–36.0)
MCV: 82.4 fL (ref 78.0–100.0)
Platelets: 228 10*3/uL (ref 150–400)
RBC: 4.84 MIL/uL (ref 3.87–5.11)
RDW: 13 % (ref 11.5–15.5)
WBC: 17.7 10*3/uL — ABNORMAL HIGH (ref 4.0–10.5)

## 2016-09-28 NOTE — Progress Notes (Signed)
PT demonstrated verbal and hands on understanding of Flutter device. 

## 2016-09-28 NOTE — Progress Notes (Signed)
[facility] PROGRESS NOTE  Evelyn Kline [id number]:[id number] DOB: 02-12-1971 DOA: 09/26/2016 PCP: Jackie Plum, MD  Interim summary and HPI 45 y.o. woman with a history of depression and anemia who presents for evaluation of 2-3 days of cough and congestion.  She has had subjective fevers.  Cough has been non-productive.  She has had progressive shortness of breath with increased wheezing.  She has also had a pleuritic type chest pain (worse with deep inspiration or cough) that radiates to her back.  She has had bronchitis before, but she feels that her symptoms have never been this bad or progressed this rapidly.  She denies any known history of asthma.  She has never required supplemental oxygen.  Assessment/Plan: 1-acute hypoxic resp failure due to COPD exacerbation -still SOB, with some improvement in air movement; but with exp wheezing and difficulty speaking in full sentences.  -will continue steroids and nebulizer treatment -will continue doxycycline, mucinex and pulmicort -now off oxygen at rest; will assess O2 needs on exertion/ambulation -will continue supportive care  -hopefully home in 24-48 hours.  2-migraine -improved and much better after Depakote given on 11/24 -will continue use PRN ibuprofen  3-hyperglycemia -no prior hx of diabetes, but condition runs in her family -imagine elevated CBG's due to steroids -will follow A1C; no coverage until CBG's > 180  4-leukocytosis -due to steroids most likely -will monitor trend   Code Status: Full Family Communication: mother over the phone  Disposition Plan: home when medically stable and breathing stabilizes. Continue IV steroids, nebulizer treatment and abx's.   Consultants:  None   Procedures:  See below for x-ray reports   Antibiotics:  Doxycycline 11/23  HPI/Subjective: Still SOB and with difficulties speaking in full sentences. Improvement in her HA and O2 sat at rest (now on RA). Sill with  intermittent coughing spells.   Objective: Vitals:   09/27/16 2219 09/28/16 0519  BP: 110/65 106/69  Pulse: 73 72  Resp: 16 18  Temp: 97.5 F (36.4 C) 98 F (36.7 C)    Intake/Output Summary (Last 24 hours) at 09/28/16 1632 Last data filed at 09/28/16 1500  Gross per 24 hour  Intake             2905 ml  Output                0 ml  Net             2905 ml   Filed Weights   09/26/16 1354 09/26/16 2221  Weight: 74.8 kg (165 lb) 74.7 kg (164 lb 10.9 oz)    Exam:   General:  Afebrile, with improvement in her HA; still SOB and with intermittent coughing spells. There has been improvement in O2 sat (on RA at rest now). Having still some difficulty speaking in full sentences.  Cardiovascular: S1 and S2, no rubs or gallops  Respiratory: scattered rhonchi, positive exp wheezing and improved air movement, not using accessory muscles.   Abdomen: soft, NT, ND, positive BS  Musculoskeletal: no edema, no cyanosis   Data Reviewed: Basic Metabolic Panel:  Recent Labs Lab 09/26/16 1402 09/27/16 0325  NA 137 137  K 4.9 4.0  CL 107 111  CO2 22 22  GLUCOSE 156* 139*  BUN 9 7  CREATININE 0.72 0.59  CALCIUM 9.1 8.9   Liver Function Tests:  Recent Labs Lab 09/27/16 0325  AST 24  ALT 28  ALKPHOS 119  BILITOT 0.9  PROT 7.2  ALBUMIN 3.8   CBC:  Recent Labs Lab 09/26/16 1402 09/27/16 0325 09/28/16 0547  WBC 8.0 7.8 17.7*  NEUTROABS 6.2  --   --   HGB 16.8* 14.1 14.0  HCT 46.9* 41.1 39.9  MCV 82.1 82.4 82.4  PLT 233 228 228    Studies: No results found.  Scheduled Meds: . budesonide (PULMICORT) nebulizer solution  0.25 mg Nebulization BID  . doxycycline  100 mg Oral BID  . ipratropium-albuterol  3 mL Nebulization QID  . methylPREDNISolone (SOLU-MEDROL) injection  60 mg Intravenous Q6H  . sodium chloride flush  3 mL Intravenous Q12H   Continuous Infusions: . sodium chloride 75 mL/hr at 09/28/16 1155    Principal Problem:   COPD (chronic obstructive  pulmonary disease) with emphysema (HCC) Active Problems:   Wheezing-associated respiratory infection (WARI)   COPD (chronic obstructive pulmonary disease) (HCC)    Time spent: 25 minutes    Vassie LollMadera, Sonjia Wilcoxson  [facility] Pager [phone]. If 7PM-7AM, please contact night-coverage at [email or web address], password [email or web address] 09/28/2016, 4:32 PM  LOS: 1 day

## 2016-09-29 ENCOUNTER — Other Ambulatory Visit: Payer: Self-pay

## 2016-09-29 DIAGNOSIS — K219 Gastro-esophageal reflux disease without esophagitis: Secondary | ICD-10-CM

## 2016-09-29 LAB — HEMOGLOBIN A1C
Hgb A1c MFr Bld: 5.7 % — ABNORMAL HIGH (ref 4.8–5.6)
Mean Plasma Glucose: 117 mg/dL

## 2016-09-29 MED ORDER — PANTOPRAZOLE SODIUM 40 MG PO TBEC
40.0000 mg | DELAYED_RELEASE_TABLET | Freq: Every day | ORAL | Status: DC
  Administered 2016-09-29 – 2016-10-01 (×3): 40 mg via ORAL
  Filled 2016-09-29 (×3): qty 1

## 2016-09-29 NOTE — Progress Notes (Addendum)
[facility] PROGRESS NOTE  Cathren ROLONDA EBRIGHT GTX:[id number] DOB: 1971/01/12 DOA: 09/26/2016 PCP: Jackie Plum, MD  Interim summary and HPI 45 y.o. woman with a history of depression and anemia who presents for evaluation of 2-3 days of cough and congestion.  She has had subjective fevers.  Cough has been non-productive.  She has had progressive shortness of breath with increased wheezing.  She has also had a pleuritic type chest pain (worse with deep inspiration or cough) that radiates to her back.  She has had bronchitis before, but she feels that her symptoms have never been this bad or progressed this rapidly.  She denies any known history of asthma.  She has never required supplemental oxygen.  Assessment/Plan: 1-acute hypoxic resp failure due to COPD exacerbation -still SOB, with some improvement in air movement; but still with exp wheezing and difficulty speaking in full sentences. Patient also easily weaned with minimal activity  -will repeat CXR in am  -will continue steroids and nebulizer treatment (hopefully start tapering tomorrow) -will continue doxycycline, mucinex and pulmicort -now off oxygen at rest; will need assessment O2 needs on exertion/ambulation -will continue supportive care  -hopefully home in 24-48 hours. In case she failed to improved to achieve discharge and follow with pulmonary service as an outpatient, please consult them in house. Otherwise PFT's and further work up/evaluation to be done in 2-3 weeks after discharge.  2-migraine -improved and much better, will continue use PRN ibuprofen  3-hyperglycemia -no prior hx of diabetes, but condition runs in her family -imagine elevated CBG's due to steroids -will follow A1C; no coverage until CBG's > 180  4-leukocytosis -due to steroids most likely -will monitor trend   5-GERD -due to steroids and NSAID's use -will start protonix  Code Status: Full Family Communication: mother over the phone   Disposition Plan: home when medically stable and breathing stabilizes. Continue IV steroids, nebulizer treatment and abx's.    Consultants:  None   Procedures:  See below for x-ray reports   Antibiotics:  Doxycycline 11/23  HPI/Subjective: Still SOB and with difficulties speaking in full sentences. There has been Improvement in her HA and air movement; but still with significant wheezing and rhonchi on exam. Continue to have intermittent coughing spells (productive now).   Objective: Vitals:   09/29/16 1036 09/29/16 1450  BP: 128/83 132/80  Pulse: 64 60  Resp: (!) 24 (!) 21  Temp: 98.3 F (36.8 C) 98.6 F (37 C)    Intake/Output Summary (Last 24 hours) at 09/29/16 1742 Last data filed at 09/29/16 1500  Gross per 24 hour  Intake             2965 ml  Output                0 ml  Net             2965 ml   Filed Weights   09/26/16 1354 09/26/16 2221  Weight: 74.8 kg (165 lb) 74.7 kg (164 lb 10.9 oz)    Exam:   General:  Afebrile, with improvement in her HA; still SOB, been really weaned with minimal exertion and with ongoing intermittent coughing spells (now productive). Having still some difficulty speaking in full sentences.  Cardiovascular: S1 and S2, no rubs or gallops  Respiratory: scattered rhonchi, positive exp wheezing; with improved air movement, not using accessory muscles. Reporting decrease capacity to perform activities due to SOB.   Abdomen: soft, NT, ND, positive BS  Musculoskeletal: no edema, no cyanosis  Data Reviewed: Basic Metabolic Panel:  Recent Labs Lab 09/26/16 1402 09/27/16 0325  NA 137 137  K 4.9 4.0  CL 107 111  CO2 22 22  GLUCOSE 156* 139*  BUN 9 7  CREATININE 0.72 0.59  CALCIUM 9.1 8.9   Liver Function Tests:  Recent Labs Lab 09/27/16 0325  AST 24  ALT 28  ALKPHOS 119  BILITOT 0.9  PROT 7.2  ALBUMIN 3.8   CBC:  Recent Labs Lab 09/26/16 1402 09/27/16 0325 09/28/16 0547  WBC 8.0 7.8 17.7*  NEUTROABS 6.2   --   --   HGB 16.8* 14.1 14.0  HCT 46.9* 41.1 39.9  MCV 82.1 82.4 82.4  PLT 233 228 228    Studies: No results found.  Scheduled Meds: . budesonide (PULMICORT) nebulizer solution  0.25 mg Nebulization BID  . doxycycline  100 mg Oral BID  . ipratropium-albuterol  3 mL Nebulization QID  . methylPREDNISolone (SOLU-MEDROL) injection  60 mg Intravenous Q6H  . pantoprazole  40 mg Oral Daily  . sodium chloride flush  3 mL Intravenous Q12H   Continuous Infusions: . sodium chloride 75 mL/hr at 09/28/16 1155    Principal Problem:   COPD (chronic obstructive pulmonary disease) with emphysema (HCC) Active Problems:   Wheezing-associated respiratory infection (WARI)   COPD (chronic obstructive pulmonary disease) (HCC)    Time spent: 25 minutes    Vassie LollMadera, Angelina Neece  [facility] Pager [phone]. If 7PM-7AM, please contact night-coverage at [email or web address], password [email or web address] 09/29/2016, 5:42 PM  LOS: 2 days

## 2016-09-30 ENCOUNTER — Inpatient Hospital Stay (HOSPITAL_COMMUNITY): Payer: Medicaid Other

## 2016-09-30 DIAGNOSIS — J988 Other specified respiratory disorders: Secondary | ICD-10-CM

## 2016-09-30 DIAGNOSIS — J439 Emphysema, unspecified: Secondary | ICD-10-CM

## 2016-09-30 LAB — BASIC METABOLIC PANEL
ANION GAP: 5 (ref 5–15)
BUN: 13 mg/dL (ref 6–20)
CALCIUM: 8.3 mg/dL — AB (ref 8.9–10.3)
CO2: 27 mmol/L (ref 22–32)
Chloride: 108 mmol/L (ref 101–111)
Creatinine, Ser: 0.67 mg/dL (ref 0.44–1.00)
Glucose, Bld: 145 mg/dL — ABNORMAL HIGH (ref 65–99)
Potassium: 3.5 mmol/L (ref 3.5–5.1)
SODIUM: 140 mmol/L (ref 135–145)

## 2016-09-30 LAB — CBC
HCT: 41.6 % (ref 36.0–46.0)
Hemoglobin: 14.1 g/dL (ref 12.0–15.0)
MCH: 28.3 pg (ref 26.0–34.0)
MCHC: 33.9 g/dL (ref 30.0–36.0)
MCV: 83.5 fL (ref 78.0–100.0)
PLATELETS: 224 10*3/uL (ref 150–400)
RBC: 4.98 MIL/uL (ref 3.87–5.11)
RDW: 12.9 % (ref 11.5–15.5)
WBC: 12.1 10*3/uL — AB (ref 4.0–10.5)

## 2016-09-30 MED ORDER — DOCUSATE SODIUM 100 MG PO CAPS
200.0000 mg | ORAL_CAPSULE | Freq: Every day | ORAL | Status: DC
  Administered 2016-09-30: 200 mg via ORAL
  Filled 2016-09-30: qty 2

## 2016-09-30 MED ORDER — METHYLPREDNISOLONE SODIUM SUCC 125 MG IJ SOLR
60.0000 mg | Freq: Two times a day (BID) | INTRAMUSCULAR | Status: DC
  Administered 2016-09-30 – 2016-10-01 (×2): 60 mg via INTRAVENOUS
  Filled 2016-09-30 (×2): qty 2

## 2016-09-30 NOTE — Progress Notes (Signed)
Pt complains of constipation, states "I have not had a bowel movement for 3-4 days, since I've been in here. I haven't moved around much, just to use the bathroom." Encouraged pt to drink water and ambulated in the hallway. Pt requested to watch saturations with ambulation, sats stayed between 95-98%. Pt requested to start a stool softener tonight. NP paged, order given for colace. Will administer and continue to monitor.

## 2016-09-30 NOTE — Progress Notes (Signed)
Progress Note    Fabian SharpMelody J Skowronek  AVW:[id number] DOB: [birth year]  DOA: 09/26/2016 PCP: Jackie PlumSEI-BONSU,GEORGE, MD    Brief Narrative:   Chief complaint: Follow-up COPD exacerbation  Graviela Pasty ArchJ Kline is an 45 y.o. female with a PMH of depression and anemia who was admitted on 09/26/16 with shortness of breath and wheezing. Patient has no history of tobacco abuse, but was discovered to have changes associated with COPD on CT of the chest.  Assessment/Plan:   Principal Problem:   COPD (chronic obstructive pulmonary disease) with emphysema (HCC) Plain films showed evidence of bronchitis, and a CT of the chest show moderate emphysematous disease of the lungs. No evidence of pulmonary embolism. Patient has been treated with bronchodilators, Pulmicort, Delsym, empiric doxycycline and steroids. She has no history of smoking but lives with a smoker. Continue flutter valve and incentive spirometry. Wean oxygen as tolerated. Will wean Solu-Medrol from 60 mg IV every 6 hours to every 12 hours.  Active Problems:   Wheezing-associated respiratory infection (WARI) Patient may have had a viral respiratory infection that triggered her symptoms. She is on doxycycline for empiric coverage.    COPD (chronic obstructive pulmonary disease) (HCC) The patient will need outpatient pulmonology follow-up for pulmonary function testing.   Family Communication/Anticipated D/C date and plan/Code Status   DVT prophylaxis: SCDs ordered. Code Status: Full Code.  Family Communication: Mother updated by telephone. Disposition Plan: Likely can be discharged 10/01/16 if tolerating steroid taper.   Medical Consultants:    None.   Procedures:    None  Anti-Infectives:    Doxycycline 09/27/16--->  Subjective:   The patient reports that she remains a bit short of breath and has mostly a dry, nagging, irritated cough. Review of symptoms is positive for pleuritic type chest pain and negative for nausea,  vomiting.  Objective:    Vitals:   09/29/16 2156 09/30/16 0556 09/30/16 0830 09/30/16 0838  BP:  (!) 133/93    Pulse:  (!) 48    Resp:  16    Temp:  98 F (36.7 C)    TempSrc:  Oral    SpO2: 100% 100% 98% 98%  Weight:      Height:        Intake/Output Summary (Last 24 hours) at 09/30/16 0958 Last data filed at 09/30/16 0900  Gross per 24 hour  Intake          1118.75 ml  Output             1000 ml  Net           118.75 ml   Filed Weights   09/26/16 1354 09/26/16 2221  Weight: 74.8 kg (165 lb) 74.7 kg (164 lb 10.9 oz)    Exam: General exam: Appears mildly anxious with paroxysms of an irritated cough. Respiratory system: Coarse in the upper airways, but no frank wheezing. Some distress with paroxysms of cough. Cardiovascular system: S1 & S2 heard, RRR. No JVD,  rubs, gallops or clicks. No murmurs. Gastrointestinal system: Abdomen is nondistended, soft and nontender. No organomegaly or masses felt. Normal bowel sounds heard. Central nervous system: Alert and oriented. No focal neurological deficits. Extremities: No clubbing,  or cyanosis. No edema. Skin: No rashes, lesions or ulcers. Psychiatry: Judgement and insight appear normal. Mood & affect appropriate.   Data Reviewed:   I have personally reviewed following labs and imaging studies:  Labs: Basic Metabolic Panel:  Recent Labs Lab 09/26/16 1402 09/27/16 0325 09/30/16 0453  NA 137 137 140  K 4.9 4.0 3.5  CL 107 111 108  CO2 22 22 27   GLUCOSE 156* 139* 145*  BUN 9 7 13   CREATININE 0.72 0.59 0.67  CALCIUM 9.1 8.9 8.3*   GFR Estimated Creatinine Clearance: 93.8 mL/min (by C-G formula based on SCr of 0.67 mg/dL). Liver Function Tests:  Recent Labs Lab 09/27/16 0325  AST 24  ALT 28  ALKPHOS 119  BILITOT 0.9  PROT 7.2  ALBUMIN 3.8   No results for input(s): LIPASE, AMYLASE in the last 168 hours. No results for input(s): AMMONIA in the last 168 hours. Coagulation profile No results for input(s):  INR, PROTIME in the last 168 hours.  CBC:  Recent Labs Lab 09/26/16 1402 09/27/16 0325 09/28/16 0547 09/30/16 0453  WBC 8.0 7.8 17.7* 12.1*  NEUTROABS 6.2  --   --   --   HGB 16.8* 14.1 14.0 14.1  HCT 46.9* 41.1 39.9 41.6  MCV 82.1 82.4 82.4 83.5  PLT 233 228 228 224   Cardiac Enzymes: No results for input(s): CKTOTAL, CKMB, CKMBINDEX, TROPONINI in the last 168 hours. BNP (last 3 results) No results for input(s): PROBNP in the last 8760 hours. CBG: No results for input(s): GLUCAP in the last 168 hours. D-Dimer: No results for input(s): DDIMER in the last 72 hours. Hgb A1c:  Recent Labs  09/28/16 0547  HGBA1C 5.7*   Lipid Profile: No results for input(s): CHOL, HDL, LDLCALC, TRIG, CHOLHDL, LDLDIRECT in the last 72 hours. Thyroid function studies: No results for input(s): TSH, T4TOTAL, T3FREE, THYROIDAB in the last 72 hours.  Invalid input(s): FREET3 Anemia work up: No results for input(s): VITAMINB12, FOLATE, FERRITIN, TIBC, IRON, RETICCTPCT in the last 72 hours. Sepsis Labs:  Recent Labs Lab 09/26/16 1402 09/27/16 0325 09/28/16 0547 09/30/16 0453  WBC 8.0 7.8 17.7* 12.1*    Microbiology No results found for this or any previous visit (from the past 240 hour(s)).  Radiology: Dg Chest 2 View  Result Date: 09/30/2016 CLINICAL DATA:  Cough, fever for 5 days EXAM: CHEST  2 VIEW COMPARISON:  09/26/2016 FINDINGS: Cardiomediastinal silhouette is stable. No acute infiltrate or pleural effusion. No pulmonary edema. Mild perihilar bronchitic changes. IMPRESSION: No infiltrate or pulmonary edema. Mild perihilar bronchitic changes. Electronically Signed   By: Natasha Mead M.D.   On: 09/30/2016 08:23    Medications:   . budesonide (PULMICORT) nebulizer solution  0.25 mg Nebulization BID  . doxycycline  100 mg Oral BID  . ipratropium-albuterol  3 mL Nebulization QID  . methylPREDNISolone (SOLU-MEDROL) injection  60 mg Intravenous Q6H  . pantoprazole  40 mg Oral Daily   . sodium chloride flush  3 mL Intravenous Q12H   Continuous Infusions: . sodium chloride 50 mL/hr at 09/30/16 1610     Medical decision making is moderately complex therefore this is a level 2 visit. (> 3 problem points, >3 data points, moderate risk: Need 2 out of 3)     LOS: 3 days   Tanay Misuraca  [facility] Pager [phone]. If unable to reach me by pager, please call my cell phone at [phone].  *Please refer to [email or web address], password [email or web address] to get updated schedule on who will round on this patient, as hospitalists switch teams weekly. If 7PM-7AM, please contact night-coverage at [email or web address], password [email or web address] for any overnight needs.  09/30/2016, 9:58 AM

## 2016-09-30 NOTE — Progress Notes (Signed)
Ambulated with patient in the hall.  O2 saturations sustained from 95-99% on RA.  Patient did not complain of shortness of breath.  Tolerated well.

## 2016-10-01 MED ORDER — PANTOPRAZOLE SODIUM 40 MG PO TBEC: 40.0000 mg | DELAYED_RELEASE_TABLET | Freq: Every day | ORAL | 0 refills | Status: DC

## 2016-10-01 MED ORDER — DEXTROMETHORPHAN POLISTIREX ER 30 MG/5ML PO SUER: 30.0000 mg | Freq: Four times a day (QID) | ORAL | 0 refills | Status: DC | PRN

## 2016-10-01 MED ORDER — IPRATROPIUM-ALBUTEROL 0.5-2.5 (3) MG/3ML IN SOLN
3.0000 mL | Freq: Three times a day (TID) | RESPIRATORY_TRACT | Status: DC
  Filled 2016-10-01: qty 3

## 2016-10-01 MED ORDER — PREDNISONE 10 MG (21) PO TBPK: ORAL_TABLET | ORAL | 0 refills | Status: DC

## 2016-10-01 MED ORDER — POLYETHYLENE GLYCOL 3350 17 G PO PACK: 17.0000 g | PACK | Freq: Every day | ORAL | 0 refills | Status: DC | PRN

## 2016-10-01 MED ORDER — POLYETHYLENE GLYCOL 3350 17 G PO PACK
17.0000 g | PACK | Freq: Every day | ORAL | Status: DC
  Administered 2016-10-01: 17 g via ORAL
  Filled 2016-10-01: qty 1

## 2016-10-01 NOTE — Discharge Summary (Signed)
Physician Discharge Summary  Evelyn Kline GXQ:[id number] DOB: 1971/01/21 DOA: 09/26/2016  PCP: Jackie Plum, MD  Admit date: 09/26/2016 Discharge date: 10/01/2016  Admitted From: Home Discharge disposition: Home   Recommendations for Outpatient Follow-Up:   The patient will be scheduled to have pulmonology follow-up for PFT testing and further diagnostic evaluation of her COPD given that she is a nonsmoker.   Discharge Diagnosis:   Principal Problem:    COPD (chronic obstructive pulmonary disease) with emphysema (HCC) Active Problems:    Wheezing-associated respiratory infection (WARI)    COPD (chronic obstructive pulmonary disease) (HCC)   Discharge Condition: Improved.  Diet recommendation: Regular.   History of Present Illness:   Evelyn Kline is an 45 y.o. female with a PMH of depression and anemia who was admitted on 09/26/16 with shortness of breath and wheezing. Patient has no history of tobacco abuse, but was discovered to have changes associated with COPD on CT of the chest.   Hospital Course by Problem:   Principal Problem:   COPD (chronic obstructive pulmonary disease) with emphysema (HCC) Plain films showed evidence of bronchitis, and a CT of the chest show moderate emphysematous disease of the lungs. No evidence of pulmonary embolism. Patient has been treated with bronchodilators, Pulmicort, Delsym, empiric doxycycline and steroids. She has no history of smoking but lives with a smoker. Continue flutter valve and incentive spirometry. Oxygen and IV steroids were weaned prior to discharge. The patient will be discharged on a prednisone Dosepak.  Active Problems:   Wheezing-associated respiratory infection (WARI) Patient may have had a viral respiratory infection that triggered her symptoms. She is on doxycycline for empiric coverage.We'll discontinue at discharge.    COPD (chronic obstructive pulmonary disease) (HCC) The patient will need  outpatient pulmonology follow-up for pulmonary function testing.    Medical Consultants:    None.   Discharge Exam:   Vitals:   10/01/16 0506 10/01/16 1340  BP: 132/74 132/77  Pulse: (!) 51 66  Resp: 18 16  Temp: 97.8 F (36.6 C) 97.9 F (36.6 C)   Vitals:   10/01/16 0506 10/01/16 0840 10/01/16 0848 10/01/16 1340  BP: 132/74   132/77  Pulse: (!) 51   66  Resp: 18   16  Temp: 97.8 F (36.6 C)   97.9 F (36.6 C)  TempSrc: Oral   Oral  SpO2: 100% 98% 98% 99%  Weight:      Height:        General exam: Appears calm and comfortable.  Respiratory system: Clear to auscultation. Respiratory effort normal. Cardiovascular system: S1 & S2 heard, RRR. No JVD,  rubs, gallops or clicks. No murmurs. Gastrointestinal system: Abdomen is nondistended, soft and nontender. No organomegaly or masses felt. Normal bowel sounds heard. Central nervous system: Alert and oriented. No focal neurological deficits. Extremities: No clubbing,  or cyanosis. No edema. Skin: No rashes, lesions or ulcers. Psychiatry: Judgement and insight appear normal. Mood & affect appropriate.    The results of significant diagnostics from this hospitalization (including imaging, microbiology, ancillary and laboratory) are listed below for reference.     Procedures and Diagnostic Studies:   Dg Chest 2 View  Result Date: 09/26/2016 CLINICAL DATA:  Congestion and productive cough for 2 days EXAM: CHEST  2 VIEW COMPARISON:  None 09/2016 FINDINGS: The heart size and mediastinal contours are within normal limits. Both lungs are clear. The visualized skeletal structures are unremarkable. IMPRESSION: No active cardiopulmonary disease. Electronically Signed   By: Adrian Prows.D.  On: 09/26/2016 14:26   Ct Angio Chest Pe W And/or Wo Contrast  Result Date: 09/26/2016 CLINICAL DATA:  Chest tightness with nonproductive cough and wheezing EXAM: CT ANGIOGRAPHY CHEST WITH CONTRAST TECHNIQUE: Multidetector CT imaging of  the chest was performed using the standard protocol during bolus administration of intravenous contrast. Multiplanar CT image reconstructions and MIPs were obtained to evaluate the vascular anatomy. CONTRAST:  77 mL Isovue 370 intravenous COMPARISON:  Chest x-ray 09/26/2016 FINDINGS: Cardiovascular: Satisfactory opacification of the pulmonary arteries to the segmental level. No evidence of pulmonary embolism. Normal heart size. No pericardial effusion. Mediastinum/Nodes: No enlarged mediastinal, hilar, or axillary lymph nodes. Thyroid gland and trachea are within normal limits. Mild distal esophageal thickening could relate to a small hiatal hernia. Lungs/Pleura: Moderate paraseptal and centrilobular emphysema within the bilateral upper and lower lobes. No consolidation or effusion. No pneumothorax. Upper Abdomen: No acute abnormality. Mild nodular thickening of the adrenal glands but without discrete nodule. Musculoskeletal: No chest wall abnormality. No acute or significant osseous findings. Review of the MIP images confirms the above findings. IMPRESSION: No CT evidence for acute pulmonary embolus. Moderate emphysematous disease of the lungs. Electronically Signed   By: Jasmine PangKim  Fujinaga M.D.   On: 09/26/2016 16:47     Labs:   Basic Metabolic Panel:  Recent Labs Lab 09/26/16 1402 09/27/16 0325 09/30/16 0453  NA 137 137 140  K 4.9 4.0 3.5  CL 107 111 108  CO2 22 22 27   GLUCOSE 156* 139* 145*  BUN 9 7 13   CREATININE 0.72 0.59 0.67  CALCIUM 9.1 8.9 8.3*   GFR Estimated Creatinine Clearance: 93.8 mL/min (by C-G formula based on SCr of 0.67 mg/dL). Liver Function Tests:  Recent Labs Lab 09/27/16 0325  AST 24  ALT 28  ALKPHOS 119  BILITOT 0.9  PROT 7.2  ALBUMIN 3.8   No results for input(s): LIPASE, AMYLASE in the last 168 hours. No results for input(s): AMMONIA in the last 168 hours. Coagulation profile No results for input(s): INR, PROTIME in the last 168 hours.  CBC:  Recent  Labs Lab 09/26/16 1402 09/27/16 0325 09/28/16 0547 09/30/16 0453  WBC 8.0 7.8 17.7* 12.1*  NEUTROABS 6.2  --   --   --   HGB 16.8* 14.1 14.0 14.1  HCT 46.9* 41.1 39.9 41.6  MCV 82.1 82.4 82.4 83.5  PLT 233 228 228 224   Cardiac Enzymes: No results for input(s): CKTOTAL, CKMB, CKMBINDEX, TROPONINI in the last 168 hours. BNP: Invalid input(s): POCBNP CBG: No results for input(s): GLUCAP in the last 168 hours. D-Dimer No results for input(s): DDIMER in the last 72 hours. Hgb A1c No results for input(s): HGBA1C in the last 72 hours. Lipid Profile No results for input(s): CHOL, HDL, LDLCALC, TRIG, CHOLHDL, LDLDIRECT in the last 72 hours. Thyroid function studies No results for input(s): TSH, T4TOTAL, T3FREE, THYROIDAB in the last 72 hours.  Invalid input(s): FREET3 Anemia work up No results for input(s): VITAMINB12, FOLATE, FERRITIN, TIBC, IRON, RETICCTPCT in the last 72 hours. Microbiology No results found for this or any previous visit (from the past 240 hour(s)).   Discharge Instructions:   Discharge Instructions    Call MD for:    Complete by:  As directed    Worsening shortness of breath, worsening cough, or a change in the phlegm you produce.   Call MD for:  temperature >100.4    Complete by:  As directed    Diet general    Complete by:  As directed    Discharge instructions    Complete by:  As directed    Avoid secondhand smoke, pollutants. Take your prednisone in the morning after breakfast. Take Protonix while on prednisone to reduce the risk of stomach irritation from the steroids.   Increase activity slowly    Complete by:  As directed    Other Restrictions    Complete by:  As directed    May return to work on 10/08/16 if breathing improved.       Medication List    TAKE these medications   albuterol 108 (90 Base) MCG/ACT inhaler Commonly known as:  PROVENTIL HFA;VENTOLIN HFA Inhale 1-2 puffs into the lungs every 6 (six) hours as needed for wheezing  or shortness of breath.   dextromethorphan 30 MG/5ML liquid Commonly known as:  DELSYM Take 5 mLs (30 mg total) by mouth every 6 (six) hours as needed for cough.   guaiFENesin 600 MG 12 hr tablet Commonly known as:  MUCINEX Take 600 mg by mouth 2 (two) times daily as needed for cough or to loosen phlegm.   pantoprazole 40 MG tablet Commonly known as:  PROTONIX Take 1 tablet (40 mg total) by mouth daily. Start taking on:  10/02/2016   polyethylene glycol packet Commonly known as:  MIRALAX / GLYCOLAX Take 17 g by mouth daily as needed for mild constipation.   predniSONE 10 MG (21) Tbpk tablet Commonly known as:  STERAPRED UNI-PAK 21 TAB Take as directed.      Follow-up Information    Chilton Greathouse, MD. Go on 10/17/2016.   Specialty:  Pulmonary Disease Why:  at 10:15   For Post Hospitalization Follow Up, PFT's, COPD, Arrive 15 minutes prior to appointment, Bring a list of medications or bottles., Bring Insurance & Photo ID, Take your Discharge Instruction to your appt Contact information: [address] [place] [address] [phone]            Time coordinating discharge: Greater than 35 minutes.  Signed:  Huber Mathers  Pager [phone] [facility] 10/01/2016, 2:55 PM      10/01/2016    Re: Fabian Sharp    The above named individual was in the hospital from 09/26/2016-10/01/2016 under my care.  She is medically clear to return to work on 10/08/16.     Dr. Trula Ore Tajae Rybicki  [facility]

## 2016-10-17 ENCOUNTER — Encounter: Payer: Self-pay | Admitting: Pulmonary Disease

## 2016-10-17 ENCOUNTER — Other Ambulatory Visit: Payer: Medicaid Other

## 2016-10-17 ENCOUNTER — Ambulatory Visit (INDEPENDENT_AMBULATORY_CARE_PROVIDER_SITE_OTHER): Payer: Medicaid Other | Admitting: Pulmonary Disease

## 2016-10-17 VITALS — BP 132/84 | HR 67 | Ht 66.0 in | Wt 159.8 lb

## 2016-10-17 DIAGNOSIS — J449 Chronic obstructive pulmonary disease, unspecified: Secondary | ICD-10-CM

## 2016-10-17 MED ORDER — ALBUTEROL SULFATE HFA 108 (90 BASE) MCG/ACT IN AERS: 1.0000 | INHALATION_SPRAY | Freq: Four times a day (QID) | RESPIRATORY_TRACT | 3 refills | Status: AC | PRN

## 2016-10-17 NOTE — Patient Instructions (Addendum)
We will get alpha-1 antitrypsin levels and phenotype. You will be scheduled for pulmonary function test.  We will start you on albuterol rescue inhaler and Spiriva.  Return to clinic in 1-2 months.

## 2016-10-17 NOTE — Progress Notes (Signed)
Evelyn SharpMelody J Patch    [id number]    January 26, 1971  Primary Care Physician:OSEI-BONSU,GEORGE, MD  Referring Physician: Jackie PlumGeorge Osei-Bonsu, MD [address] [place], [place] [id number]  Chief complaint:  Consult for evaluation of COPD  HPI: 45 year old with history of depression, anemia. She was admitted to the hospital from 11/23 to 10/01/16 with cough, congestion or wheezing. She was treated for a COPD exacerbation. She had a CTA which was negative for PE but showed moderate emphysematous changes. She was discharged on albuterol but has not filled it yet.  She has daily symptoms of dyspnea with activity and rest, productive cough. She denies any chest pain, palpitations, fevers, chills. Nonsmoker but has been exposed to secondhand smoke for the past 5 years from her wife.she has a family history of COPD with one maternal aunt and one maternal great aunt who died of complications related to COPD.  Both were smokers. 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 She does not report any exposures to asbestos or other toxins.  Outpatient Encounter Prescriptions as of 10/17/2016  Medication Sig  . mirtazapine (REMERON) 15 MG tablet Take 15 mg by mouth at bedtime.  Marland Kitchen. OLANZapine-FLUoxetine (SYMBYAX) 6-25 MG capsule Take 1 capsule by mouth at bedtime.  Marland Kitchen. albuterol (PROVENTIL HFA;VENTOLIN HFA) 108 (90 Base) MCG/ACT inhaler Inhale 1-2 puffs into the lungs every 6 (six) hours as needed for wheezing or shortness of breath. (Patient not taking: Reported on 10/17/2016)  . guaiFENesin (MUCINEX) 600 MG 12 hr tablet Take 600 mg by mouth 2 (two) times daily as needed for cough or to loosen phlegm.  . pantoprazole (PROTONIX) 40 MG tablet Take 1 tablet (40 mg total) by mouth daily. (Patient not taking: Reported on 10/17/2016)  . polyethylene glycol (MIRALAX / GLYCOLAX) packet Take 17 g by mouth daily as needed for mild constipation. (Patient not taking: Reported on 10/17/2016)  . [DISCONTINUED]  dextromethorphan (DELSYM) 30 MG/5ML liquid Take 5 mLs (30 mg total) by mouth every 6 (six) hours as needed for cough. (Patient not taking: Reported on 10/17/2016)  . [DISCONTINUED] predniSONE (STERAPRED UNI-PAK 21 TAB) 10 MG (21) TBPK tablet Take as directed. (Patient not taking: Reported on 10/17/2016)   No facility-administered encounter medications on file as of 10/17/2016.     Allergies as of 10/17/2016  . (No Known Allergies)    Past Medical History:  Diagnosis Date  . Anemia   . Bowel obstruction 2012  . Depression   . History of blood transfusion     Past Surgical History:  Procedure Laterality Date  . ABDOMINAL HYSTERECTOMY    . LACERATION REPAIR Right    arm with artery involvement  . ORIF ANKLE FRACTURE Left 09/04/2015   Procedure: OPEN REDUCTION INTERNAL FIXATION (ORIF) LEFT BIOMALLEOLAR FRACTURE;  Surgeon: Toni ArthursJohn Hewitt, MD;  Location: [facility] OR;  Service: Orthopedics;  Laterality: Left;  . SMALL INTESTINE SURGERY  2012  . TUBAL LIGATION    . VAGINAL DELIVERY     x3, epidural anesth. with 2 of them  . WISDOM TOOTH EXTRACTION      Family History  Problem Relation Age of Onset  . Diabetes Father   . Diabetes Other   . Hypertension Other   . COPD Paternal Grandmother   . COPD Maternal Aunt     Social History   Social History  . Marital status: Legally Separated    Spouse name: N/A  . Number of children: N/A  .  Years of education: N/A   Occupational History  . Not on file.   Social History Main Topics  . Smoking status: Never Smoker  . Smokeless tobacco: Never Used  . Alcohol use No  . Drug use: No  . Sexual activity: Not on file   Other Topics Concern  . Not on file   Social History Narrative   Married, lives with spouse and grandkids   3 children   OCCUPATION: security and CNA     Review of systems: Review of Systems  Constitutional: Negative for fever and chills.  HENT: Negative.   Eyes: Negative for blurred vision.  Respiratory: as per  HPI  Cardiovascular: Negative for chest pain and palpitations.  Gastrointestinal: Negative for vomiting, diarrhea, blood per rectum. Genitourinary: Negative for dysuria, urgency, frequency and hematuria.  Musculoskeletal: Negative for myalgias, back pain and joint pain.  Skin: Negative for itching and rash.  Neurological: Negative for dizziness, tremors, focal weakness, seizures and loss of consciousness.  Endo/Heme/Allergies: Negative for environmental allergies.  Psychiatric/Behavioral: Negative for depression, suicidal ideas and hallucinations.  All other systems reviewed and are negative.   Physical Exam: Blood pressure 132/84, pulse 67, height 5\' 6"  (1.676 m), weight 159 lb 12.8 oz (72.5 kg), SpO2 98 %. Gen:      No acute distress HEENT:  EOMI, sclera anicteric Neck:     No masses; no thyromegaly Lungs:    Clear to auscultation bilaterally; normal respiratory effort CV:         Regular rate and rhythm; no murmurs Abd:      + bowel sounds; soft, non-tender; no palpable masses, no distension Ext:    No edema; adequate peripheral perfusion Skin:      Warm and dry; no rash Neuro: alert and oriented x 3 Psych: normal mood and affect  Data Reviewed: CXR 09/30/16-bronchitis changes. CTA 09/26/16-no PE, diffuse moderate emphysematous changes. All images reviewed.  Assessment:  Evaluation for COPD, recent admission for exacerbation. She has emphysematous changes on her CT scan but is a never smoker. She does have family history of COPD and exposure to secondhand smoke.  We will evaluate with pulmonary function tests and alpha-1 antitrypsin levels and phenotype. I'll start her on Spiriva and albuterol when necessary.  She'll return to clinic in 1-2 months to review test results and response to therapy.  Plan/Recommendations: - Start Spiriva and Albuterol rescue inhaler - PFTs, A1AT level and phenotype.  Chilton Greathouse MD [facility] Pager [phone] 10/17/2016, 10:55 AM  CC: Jackie Plum, MD

## 2016-10-22 LAB — ALPHA-1 ANTITRYPSIN PHENOTYPE: A1 ANTITRYPSIN: 77 mg/dL — AB (ref 83–199)

## 2016-12-04 ENCOUNTER — Ambulatory Visit (INDEPENDENT_AMBULATORY_CARE_PROVIDER_SITE_OTHER): Payer: Medicaid Other | Admitting: Pulmonary Disease

## 2016-12-04 ENCOUNTER — Encounter: Payer: Self-pay | Admitting: Pulmonary Disease

## 2016-12-04 VITALS — BP 112/70 | HR 75 | Ht 66.0 in | Wt 174.0 lb

## 2016-12-04 DIAGNOSIS — J449 Chronic obstructive pulmonary disease, unspecified: Secondary | ICD-10-CM

## 2016-12-04 LAB — PULMONARY FUNCTION TEST
DL/VA % PRED: 153 %
DL/VA: 7.82 ml/min/mmHg/L
DLCO COR % PRED: 73 %
DLCO COR: 20.32 ml/min/mmHg
DLCO unc % pred: 74 %
DLCO unc: 20.44 ml/min/mmHg
FEF 25-75 Post: 1.35 L/sec
FEF 25-75 Pre: 1.63 L/sec
FEF2575-%CHANGE-POST: -17 %
FEF2575-%PRED-PRE: 56 %
FEF2575-%Pred-Post: 47 %
FEV1-%Change-Post: -4 %
FEV1-%PRED-PRE: 67 %
FEV1-%Pred-Post: 64 %
FEV1-Post: 1.75 L
FEV1-Pre: 1.82 L
FEV1FVC-%Change-Post: 4 %
FEV1FVC-%PRED-PRE: 94 %
FEV6-%Change-Post: -7 %
FEV6-%PRED-POST: 65 %
FEV6-%PRED-PRE: 71 %
FEV6-POST: 2.13 L
FEV6-Pre: 2.31 L
FEV6FVC-%PRED-PRE: 102 %
FEV6FVC-%Pred-Post: 102 %
FVC-%Change-Post: -7 %
FVC-%PRED-POST: 64 %
FVC-%Pred-Pre: 70 %
FVC-Post: 2.13 L
FVC-Pre: 2.32 L
PRE FEV1/FVC RATIO: 79 %
Post FEV1/FVC ratio: 82 %
Post FEV6/FVC ratio: 100 %
Pre FEV6/FVC Ratio: 100 %
RV % pred: 91 %
RV: 1.63 L
TLC % pred: 77 %
TLC: 4.18 L

## 2016-12-04 MED ORDER — TIOTROPIUM BROMIDE MONOHYDRATE 2.5 MCG/ACT IN AERS: 2.0000 | INHALATION_SPRAY | Freq: Every day | RESPIRATORY_TRACT | 5 refills | Status: DC

## 2016-12-04 NOTE — Progress Notes (Signed)
Evelyn Kline    [id number]    04-May-1971  Primary Care Physician:OSEI-BONSU,GEORGE, MD  Referring Physician: Jackie Plum, MD [address] [place], [place] [address]  Chief complaint:  Consult for evaluation of COPD  HPI: 46 year old with history of depression, anemia. She was admitted to the hospital from 11/23 to 10/01/16 with cough, congestion or wheezing. She was treated for a COPD exacerbation. She had a CTA which was negative for PE but showed moderate emphysematous changes. She was discharged on albuterol but has not filled it yet.  She has daily symptoms of dyspnea with activity and rest, productive cough. She denies any chest pain, palpitations, fevers, chills. Nonsmoker but has been exposed to secondhand smoke for the past 5 years from her wife.she has a family history of COPD with one maternal aunt and one maternal great aunt who died of complications related to COPD.  Both were smokers. She works as a Chief Technology Officer. She does not report any exposures to asbestos or other toxins.  Interim History: Her dyspnea continues unchanged.We were supposed to start her on Spiriva but for some reason the order to not go through. She is continuous on the albuterol rescue inhaler.  Outpatient Encounter Prescriptions as of 12/04/2016  Medication Sig  . albuterol (PROVENTIL HFA;VENTOLIN HFA) 108 (90 Base) MCG/ACT inhaler Inhale 1-2 puffs into the lungs every 6 (six) hours as needed for wheezing or shortness of breath.  . mirtazapine (REMERON) 15 MG tablet Take 15 mg by mouth at bedtime.  Marland Kitchen OLANZapine-FLUoxetine (SYMBYAX) 6-25 MG capsule Take 1 capsule by mouth at bedtime.  . [DISCONTINUED] guaiFENesin (MUCINEX) 600 MG 12 hr tablet Take 600 mg by mouth 2 (two) times daily as needed for cough or to loosen phlegm.  . [DISCONTINUED] pantoprazole (PROTONIX) 40 MG tablet Take 1 tablet (40 mg total) by mouth daily.  . [DISCONTINUED] polyethylene glycol (MIRALAX /  GLYCOLAX) packet Take 17 g by mouth daily as needed for mild constipation.   No facility-administered encounter medications on file as of 12/04/2016.     Allergies as of 12/04/2016  . (No Known Allergies)    Past Medical History:  Diagnosis Date  . Anemia   . Bowel obstruction 2012  . Depression   . History of blood transfusion     Past Surgical History:  Procedure Laterality Date  . ABDOMINAL HYSTERECTOMY    . LACERATION REPAIR Right    arm with artery involvement  . ORIF ANKLE FRACTURE Left 09/04/2015   Procedure: OPEN REDUCTION INTERNAL FIXATION (ORIF) LEFT BIOMALLEOLAR FRACTURE;  Surgeon: Toni Arthurs, MD;  Location: [facility] OR;  Service: Orthopedics;  Laterality: Left;  . SMALL INTESTINE SURGERY  2012  . TUBAL LIGATION    . VAGINAL DELIVERY     x3, epidural anesth. with 2 of them  . WISDOM TOOTH EXTRACTION      Family History  Problem Relation Age of Onset  . Diabetes Father   . Diabetes Other   . Hypertension Other   . COPD Paternal Grandmother   . COPD Maternal Aunt     Social History   Social History  . Marital status: Married    Spouse name: N/A  . Number of children: N/A  . Years of education: N/A   Occupational History  . Not on file.   Social History Main Topics  . Smoking status: Never Smoker  . Smokeless tobacco: Never Used  . Alcohol use No  .  Drug use: No  . Sexual activity: Not on file   Other Topics Concern  . Not on file   Social History Narrative   Married, lives with spouse and grandkids   3 children   OCCUPATION: security and CNA   Review of systems: Review of Systems  Constitutional: Negative for fever and chills.  HENT: Negative.   Eyes: Negative for blurred vision.  Respiratory: as per HPI  Cardiovascular: Negative for chest pain and palpitations.  Gastrointestinal: Negative for vomiting, diarrhea, blood per rectum. Genitourinary: Negative for dysuria, urgency, frequency and hematuria.  Musculoskeletal: Negative for  myalgias, back pain and joint pain.  Skin: Negative for itching and rash.  Neurological: Negative for dizziness, tremors, focal weakness, seizures and loss of consciousness.  Endo/Heme/Allergies: Negative for environmental allergies.  Psychiatric/Behavioral: Negative for depression, suicidal ideas and hallucinations.  All other systems reviewed and are negative.   Physical Exam: Blood pressure 112/70, pulse 75, height 5\' 6"  (1.676 m), weight 174 lb (78.9 kg), SpO2 98 %. Gen:      No acute distress HEENT:  EOMI, sclera anicteric Neck:     No masses; no thyromegaly Lungs:    Clear to auscultation bilaterally; normal respiratory effort CV:         Regular rate and rhythm; no murmurs Abd:      + bowel sounds; soft, non-tender; no palpable masses, no distension Ext:    No edema; adequate peripheral perfusion Skin:      Warm and dry; no rash Neuro: alert and oriented x 3 Psych: normal mood and affect  Data Reviewed: CXR 09/30/16-bronchitis changes. CTA 09/26/16-no PE, diffuse moderate emphysematous changes. All images reviewed.  PFTs 12/04/16 FVC 2.13 (64%) FEV1 1.75 (64%) F/F 82 TLC 77% RV/TLC 121% DLCO 74% Minimal obstructive airway disease as shown by the shape of the flow loops Minimal restriction Air trapping  Assessment:  Follow up for COPD, recent admission for exacerbation. A1AT deficiency She has emphysematous changes on her CT scan but is a never smoker. She does have family history of COPD and exposure to secondhand smoke. Alpha-1 antitrypsin levels and phenotype show slightly low levels with MZ phenotype. She does not meet the criteria for infusion therapy at present but will continue to follow her for any decline in her lung function. Repeat PFTs every year.  I'll start her on Spiriva and she'll continue the albuterol rescue inhaler.  Plan/Recommendations: - Start Spiriva and Albuterol rescue inhaler - Return in 6 months  Chilton Greathouse MD [facility] Pager [phone] 12/04/2016, 4:41 PM  CC: Jackie Plum, MD

## 2016-12-04 NOTE — Patient Instructions (Addendum)
We will start you on Spiriva inhaler. Continue the albuterol rescue inhaler Your alpha-1 antitrypsin level and implications were discussed with you.  Return to clinic in  months.

## 2016-12-11 ENCOUNTER — Telehealth: Payer: Self-pay

## 2016-12-11 NOTE — Telephone Encounter (Signed)
PA request received for Spiriva Respimat Called [employer] at [phone] to initiate PA>  PA has been approved through 12/06/2017 PA# [id number] Pharmacy aware. Nothing further needed.

## 2016-12-20 ENCOUNTER — Emergency Department (HOSPITAL_COMMUNITY)
Admission: EM | Admit: 2016-12-20 | Discharge: 2016-12-20 | Disposition: A | Discharge status: Home / Self Care | Payer: Medicaid Other | Attending: Emergency Medicine | Admitting: Emergency Medicine

## 2016-12-20 ENCOUNTER — Encounter (HOSPITAL_COMMUNITY): Payer: Self-pay | Admitting: Emergency Medicine

## 2016-12-20 DIAGNOSIS — Z79899 Other long term (current) drug therapy: Secondary | ICD-10-CM | POA: Insufficient documentation

## 2016-12-20 DIAGNOSIS — R197 Diarrhea, unspecified: Secondary | ICD-10-CM | POA: Diagnosis present

## 2016-12-20 DIAGNOSIS — J449 Chronic obstructive pulmonary disease, unspecified: Secondary | ICD-10-CM | POA: Diagnosis not present

## 2016-12-20 HISTORY — DX: Chronic obstructive pulmonary disease, unspecified: J44.9

## 2016-12-20 LAB — COMPREHENSIVE METABOLIC PANEL
ALK PHOS: 126 U/L (ref 38–126)
ALT: 60 U/L — AB (ref 14–54)
AST: 60 U/L — AB (ref 15–41)
Albumin: 4.3 g/dL (ref 3.5–5.0)
Anion gap: 9 (ref 5–15)
BUN: 7 mg/dL (ref 6–20)
CALCIUM: 9.1 mg/dL (ref 8.9–10.3)
CHLORIDE: 106 mmol/L (ref 101–111)
CO2: 22 mmol/L (ref 22–32)
CREATININE: 0.68 mg/dL (ref 0.44–1.00)
GFR calc non Af Amer: >60 mL/min (ref >60)
GLUCOSE: 125 mg/dL — AB (ref 65–99)
Potassium: 3.5 mmol/L (ref 3.5–5.1)
SODIUM: 137 mmol/L (ref 135–145)
Total Bilirubin: 0.8 mg/dL (ref 0.3–1.2)
Total Protein: 7.5 g/dL (ref 6.5–8.1)

## 2016-12-20 LAB — TYPE AND SCREEN
ABO/RH(D): B POS
ANTIBODY SCREEN: NEGATIVE

## 2016-12-20 LAB — URINALYSIS, ROUTINE W REFLEX MICROSCOPIC
BILIRUBIN URINE: NEGATIVE
Glucose, UA: NEGATIVE mg/dL
HGB URINE DIPSTICK: NEGATIVE
Ketones, ur: NEGATIVE mg/dL
Leukocytes, UA: NEGATIVE
Nitrite: NEGATIVE
PROTEIN: NEGATIVE mg/dL
Specific Gravity, Urine: 1.01 (ref 1.005–1.030)
pH: 5 (ref 5.0–8.0)

## 2016-12-20 LAB — CBC
HCT: 42.1 % (ref 36.0–46.0)
Hemoglobin: 14.5 g/dL (ref 12.0–15.0)
MCH: 28.6 pg (ref 26.0–34.0)
MCHC: 34.4 g/dL (ref 30.0–36.0)
MCV: 83 fL (ref 78.0–100.0)
PLATELETS: 227 10*3/uL (ref 150–400)
RBC: 5.07 MIL/uL (ref 3.87–5.11)
RDW: 13.6 % (ref 11.5–15.5)
WBC: 5.8 10*3/uL (ref 4.0–10.5)

## 2016-12-20 LAB — LIPASE, BLOOD: LIPASE: 54 U/L — AB (ref 11–51)

## 2016-12-20 LAB — ABO/RH: ABO/RH(D): B POS

## 2016-12-20 LAB — POC OCCULT BLOOD, ED: Fecal Occult Bld: NEGATIVE

## 2016-12-20 MED ORDER — LOPERAMIDE HCL 2 MG PO CAPS: 2.0000 mg | ORAL_CAPSULE | Freq: Three times a day (TID) | ORAL | 0 refills | Status: DC | PRN

## 2016-12-20 NOTE — ED Provider Notes (Signed)
[facility]-EMERGENCY DEPT Provider Note   CSN: [id number] Arrival date & time: 12/20/16  1304     History   Chief Complaint Chief Complaint  Patient presents with  . Diarrhea  . Rectal Bleeding    HPI Evelyn Kline is a 46 y.o. female.  The history is provided by the patient and medical records.    46 y.o. F with hx of anemia, COPD, depression, presenting to the ED for diarrhea.  Patient reports diarrhea for the past 4 days.  States watery for the most part.  No nausea or vomiting.  Denies abdominal pain or fever.  No urinary symptoms.  States today she had a large episode of diarrhea this morning and noticed some bright red blood with clots in the commode.  States bright red blood when wiping.  States her rectum feels "irritated" from wiping so much lately.  No recent travel.  No sick contacts.  No abx use.  No hx of GI issues in the past.  States she had a colonoscopy sometime in the past 2 years with Dr. Bosie Clos.  State she was told it was normal.  Past Medical History:  Diagnosis Date  . Anemia   . Bowel obstruction 2012  . COPD (chronic obstructive pulmonary disease) (HCC)   . Depression   . History of blood transfusion     Patient Active Problem List   Diagnosis Date Noted  . COPD (chronic obstructive pulmonary disease) (HCC) 09/27/2016  . Wheezing-associated respiratory infection (WARI) 09/26/2016  . COPD (chronic obstructive pulmonary disease) with emphysema (HCC) 09/26/2016  . Bimalleolar fracture 09/04/2015    Past Surgical History:  Procedure Laterality Date  . ABDOMINAL HYSTERECTOMY    . LACERATION REPAIR Right    arm with artery involvement  . ORIF ANKLE FRACTURE Left 09/04/2015   Procedure: OPEN REDUCTION INTERNAL FIXATION (ORIF) LEFT BIOMALLEOLAR FRACTURE;  Surgeon: Toni Arthurs, MD;  Location: [facility] OR;  Service: Orthopedics;  Laterality: Left;  . SMALL INTESTINE SURGERY  2012  . TUBAL LIGATION    . VAGINAL DELIVERY     x3, epidural anesth. with 2 of them  .  WISDOM TOOTH EXTRACTION      OB History    No data available       Home Medications    Prior to Admission medications   Medication Sig Start Date End Date Taking? Authorizing Provider  albuterol (PROVENTIL HFA;VENTOLIN HFA) 108 (90 Base) MCG/ACT inhaler Inhale 1-2 puffs into the lungs every 6 (six) hours as needed for wheezing or shortness of breath. 10/17/16   Praveen Mannam, MD  mirtazapine (REMERON) 15 MG tablet Take 15 mg by mouth at bedtime.    Historical Provider, MD  OLANZapine-FLUoxetine (SYMBYAX) 6-25 MG capsule Take 1 capsule by mouth at bedtime.    Historical Provider, MD  Tiotropium Bromide Monohydrate (SPIRIVA RESPIMAT) 2.5 MCG/ACT AERS Inhale 2 puffs into the lungs daily. 12/04/16   Chilton Greathouse, MD    Family History Family History  Problem Relation Age of Onset  . Diabetes Father   . Diabetes Other   . Hypertension Other   . COPD Paternal Grandmother   . COPD Maternal Aunt     Social History Social History  Substance Use Topics  . Smoking status: Never Smoker  . Smokeless tobacco: Never Used  . Alcohol use No     Allergies   Patient has no known allergies.   Review of Systems Review of Systems  Gastrointestinal: Positive for diarrhea. Negative for abdominal distention, abdominal  pain, nausea and vomiting.  All other systems reviewed and are negative.    Physical Exam Updated Vital Signs BP 134/90   Pulse 78   Temp 98.3 F (36.8 C) (Oral)   Resp 16   SpO2 99%   Physical Exam  Constitutional: She is oriented to person, place, and time. She appears well-developed and well-nourished.  HENT:  Head: Normocephalic and atraumatic.  Mouth/Throat: Oropharynx is clear and moist.  Eyes: Conjunctivae and EOM are normal. Pupils are equal, round, and reactive to light.  Neck: Normal range of motion.  Cardiovascular: Normal rate, regular rhythm and normal heart sounds.   Pulmonary/Chest: Effort normal and breath sounds normal. No respiratory distress.  She has no wheezes.  Abdominal: Soft. Bowel sounds are normal. There is no tenderness. There is no rebound.  Abdomen soft, non-distended, no focal tenderness or peritoneal signs  Genitourinary:  Genitourinary Comments: Rectum normal in appearance without visible hemorrhoids; no frank blood on DRE; guaiac negative  Musculoskeletal: Normal range of motion.  Neurological: She is alert and oriented to person, place, and time.  Skin: Skin is warm and dry.  Psychiatric: She has a normal mood and affect.  Nursing note and vitals reviewed.    ED Treatments / Results  Labs (all labs ordered are listed, but only abnormal results are displayed) Labs Reviewed  LIPASE, BLOOD - Abnormal; Notable for the following:       Result Value   Lipase 54 (*)    All other components within normal limits  COMPREHENSIVE METABOLIC PANEL - Abnormal; Notable for the following:    Glucose, Bld 125 (*)    AST 60 (*)    ALT 60 (*)    All other components within normal limits  URINALYSIS, ROUTINE W REFLEX MICROSCOPIC - Abnormal; Notable for the following:    APPearance HAZY (*)    All other components within normal limits  CBC  POC OCCULT BLOOD, ED  TYPE AND SCREEN  ABO/RH    EKG  EKG Interpretation None       Radiology No results found.  Procedures Procedures (including critical care time)  Medications Ordered in ED Medications - No data to display   Initial Impression / Assessment and Plan / ED Course  I have reviewed the triage vital signs and the nursing notes.  Pertinent labs & imaging results that were available during my care of the patient were reviewed by me and considered in my medical decision making (see chart for details).  46 year old female here with diarrhea for the past 4 days. She is afebrile and nontoxic, appears well. Her abdomen is soft and benign. She has no tenderness or peritoneal signs. Labwork obtained from triage is reassuring. She has a mild elevation of her  AST/ALT, however similar to previous labs on comparison. White count is normal. H&H is stable. Rectal exam performed, no gross blood, hemorrhoids, or masses. Fecal occult test is negative. Given benign exam and reassuring lab work, feel she is stable for discharge. I do not suspect this is acute C. difficile colitis or diverticulitis. We'll plan to discharge home with supportive care. I recommended that she follow-up closely with her primary care doctor if not improving in the next few days.  Discussed plan with patient, she acknowledged understanding and agreed with plan of care.  Return precautions given for new or worsening symptoms.  Final Clinical Impressions(s) / ED Diagnoses   Final diagnoses:  Diarrhea, unspecified type    New Prescriptions New Prescriptions  LOPERAMIDE (IMODIUM) 2 MG CAPSULE    Take 1 capsule (2 mg total) by mouth every 8 (eight) hours as needed for diarrhea or loose stools.     Garlon Hatchet, PA-C 12/20/16 1659    Tilden Fossa, MD 12/20/16 Windy Fast

## 2016-12-20 NOTE — ED Notes (Signed)
Patient is alert and oriented x3.  She was given DC instructions and follow up visit instructions.  Patient gave verbal understanding. She was DC ambulatory under her own power to home.  V/S stable.  He was not showing any signs of distress on DC 

## 2016-12-20 NOTE — Discharge Instructions (Signed)
Your lab work today was normal. Can take imodium as needed for diarrhea.  Stools should gradually become formed again. Follow-up with your primary care doctor. Return here for new concerns.

## 2016-12-20 NOTE — ED Triage Notes (Signed)
Pt reports she has had diarrhea for the past 4 days. Saw blood in stool today 1x. No pain. no emesis.

## 2017-02-27 ENCOUNTER — Ambulatory Visit: Payer: Medicaid Other | Admitting: Acute Care

## 2017-02-27 ENCOUNTER — Ambulatory Visit: Payer: Medicaid Other | Admitting: Pulmonary Disease

## 2017-03-03 ENCOUNTER — Encounter: Payer: Self-pay | Admitting: Pulmonary Disease

## 2017-03-03 ENCOUNTER — Ambulatory Visit (INDEPENDENT_AMBULATORY_CARE_PROVIDER_SITE_OTHER): Payer: Medicaid Other | Admitting: Pulmonary Disease

## 2017-03-03 VITALS — BP 122/74 | HR 80 | Ht 66.0 in | Wt 188.2 lb

## 2017-03-03 DIAGNOSIS — J449 Chronic obstructive pulmonary disease, unspecified: Secondary | ICD-10-CM

## 2017-03-03 DIAGNOSIS — E8801 Alpha-1-antitrypsin deficiency: Secondary | ICD-10-CM | POA: Insufficient documentation

## 2017-03-03 NOTE — Progress Notes (Signed)
Evelyn Kline    [id number]    11/09/1970  Primary Care Physician:OSEI-BONSU,GEORGE, MD  Referring Physician: Jackie Plum, MD [id number] [place], [place] [address]  Chief complaint:   Follow-up for COPD Alpha-1 antitrypsin deficiency, PI MZ phenotype  HPI: 46 year old with history of depression, anemia. She was admitted to the hospital from 11/23 to 10/01/16 with cough, congestion or wheezing. She was treated for a COPD exacerbation. She had a CTA which was negative for PE but showed moderate emphysematous changes. She was discharged on albuterol but has not filled it yet.  She has daily symptoms of dyspnea with activity and rest, productive cough. She denies any chest pain, palpitations, fevers, chills. Nonsmoker but has been exposed to secondhand smoke for the past 5 years from her wife.she has a family history of COPD with one maternal aunt and one maternal great aunt who died of complications related to COPD.  Both were smokers. She works as a Chief Technology Officer. She does not report any exposures to asbestos or other toxins.  Interim History: She continues on Spiriva. Had to go on a course of antibiotics and prednisone for exacerbation. She feels back to baseline now. She was seen in the emergency room 2 months ago for diarrhea. She was noted to have mild elevation in transaminases, also had elevation in lipase. She denies any diarrhea, nausea, vomiting at present. She has generalized bloating and feels she has gained a lot of weight recently.  Outpatient Encounter Prescriptions as of 03/03/2017  Medication Sig  . albuterol (PROVENTIL HFA;VENTOLIN HFA) 108 (90 Base) MCG/ACT inhaler Inhale 1-2 puffs into the lungs every 6 (six) hours as needed for wheezing or shortness of breath.  . loperamide (IMODIUM) 2 MG capsule Take 1 capsule (2 mg total) by mouth every 8 (eight) hours as needed for diarrhea or loose stools.  . mirtazapine (REMERON) 15 MG tablet Take 15  mg by mouth at bedtime.  Marland Kitchen OLANZapine-FLUoxetine (SYMBYAX) 6-25 MG capsule Take 1 capsule by mouth at bedtime.  . Tiotropium Bromide Monohydrate (SPIRIVA RESPIMAT) 2.5 MCG/ACT AERS Inhale 2 puffs into the lungs daily.  . traZODone (DESYREL) 50 MG tablet Take 50 mg by mouth at bedtime as needed. for sleep   No facility-administered encounter medications on file as of 03/03/2017.     Allergies as of 03/03/2017  . (No Known Allergies)    Past Medical History:  Diagnosis Date  . Anemia   . Bowel obstruction (HCC) 2012  . COPD (chronic obstructive pulmonary disease) (HCC)   . Depression   . History of blood transfusion     Past Surgical History:  Procedure Laterality Date  . ABDOMINAL HYSTERECTOMY    . LACERATION REPAIR Right    arm with artery involvement  . ORIF ANKLE FRACTURE Left 09/04/2015   Procedure: OPEN REDUCTION INTERNAL FIXATION (ORIF) LEFT BIOMALLEOLAR FRACTURE;  Surgeon: Toni Arthurs, MD;  Location: [facility] OR;  Service: Orthopedics;  Laterality: Left;  . SMALL INTESTINE SURGERY  2012  . TUBAL LIGATION    . VAGINAL DELIVERY     x3, epidural anesth. with 2 of them  . WISDOM TOOTH EXTRACTION      Family History  Problem Relation Age of Onset  . Diabetes Father   . Diabetes Other   . Hypertension Other   . COPD Paternal Grandmother   . COPD Maternal Aunt     Social History   Social History  . Marital  status: Married    Spouse name: N/A  . Number of children: N/A  . Years of education: N/A   Occupational History  . Not on file.   Social History Main Topics  . Smoking status: Never Smoker  . Smokeless tobacco: Never Used  . Alcohol use No  . Drug use: No  . Sexual activity: Not on file   Other Topics Concern  . Not on file   Social History Narrative   Married, lives with spouse and grandkids   3 children   OCCUPATION: security and CNA   Review of systems: Review of Systems  Constitutional: Negative for fever and chills.  HENT: Negative.   Eyes:  Negative for blurred vision.  Respiratory: as per HPI  Cardiovascular: Negative for chest pain and palpitations.  Gastrointestinal: Negative for vomiting, diarrhea, blood per rectum. Genitourinary: Negative for dysuria, urgency, frequency and hematuria.  Musculoskeletal: Negative for myalgias, back pain and joint pain.  Skin: Negative for itching and rash.  Neurological: Negative for dizziness, tremors, focal weakness, seizures and loss of consciousness.  Endo/Heme/Allergies: Negative for environmental allergies.  Psychiatric/Behavioral: Negative for depression, suicidal ideas and hallucinations.  All other systems reviewed and are negative.   Physical Exam: Blood pressure 112/70, pulse 75, height 5\' 6"  (1.676 m), weight 174 lb (78.9 kg), SpO2 98 %. Gen:      No acute distress HEENT:  EOMI, sclera anicteric Neck:     No masses; no thyromegaly Lungs:    Clear to auscultation bilaterally; normal respiratory effort CV:         Regular rate and rhythm; no murmurs Abd:      + bowel sounds; soft, non-tender; no palpable masses, no distension Ext:    No edema; adequate peripheral perfusion Skin:      Warm and dry; no rash Neuro: alert and oriented x 3 Psych: normal mood and affect  Data Reviewed: CXR 09/30/16-bronchitis changes. CTA 09/26/16-no PE, diffuse moderate emphysematous changes. I reviewed all images personally  PFTs 12/04/16 FVC 2.13 (64%) FEV1 1.75 (64%) F/F 82 TLC 77% RV/TLC 121% DLCO 74% Minimal obstructive airway disease as shown by the shape of the flow loops Minimal restriction Air trapping  CMP     Component Value Date/Time   NA 137 12/20/2016 1336   K 3.5 12/20/2016 1336   CL 106 12/20/2016 1336   CO2 22 12/20/2016 1336   GLUCOSE 125 (H) 12/20/2016 1336   BUN 7 12/20/2016 1336   CREATININE 0.68 12/20/2016 1336   CALCIUM 9.1 12/20/2016 1336   PROT 7.5 12/20/2016 1336   ALBUMIN 4.3 12/20/2016 1336   AST 60 (H) 12/20/2016 1336   ALT 60 (H) 12/20/2016  1336   ALKPHOS 126 12/20/2016 1336   BILITOT 0.8 12/20/2016 1336   GFRNONAA >60 12/20/2016 1336   GFRAA >60 12/20/2016 1336    Assessment:  Follow up for COPD, recent admission for exacerbation. A1AT deficiency She has emphysematous changes on her CT scan but is a never smoker. She does have family history of COPD and exposure to secondhand smoke. Alpha-1 antitrypsin levels and phenotype show slightly low levels with MZ phenotype. She does not meet the criteria for infusion therapy at present but will continue to follow her for any decline in her lung function. Repeat PFTs every year. Continue Spiriva and albuterol inhaler  Elevated LFTs Has slight elevation in transaminases during recent evaluation for diarrhea. Also has elevated lipase of unclear etiology. Review of her chart shows prior evaluation of AST/ALT in  2013. I'll refer her to GI for evaluation of hepatitis secondary to alpha-1 antitrypsin deficiency.  Plan/Recommendations: - Continue Spiriva and Albuterol rescue inhaler - GI eval  Chilton Greathouse MD  Pulmonary and Critical Care Pager [phone] 03/03/2017, 12:16 PM  CC: Jackie Plum, MD

## 2017-03-03 NOTE — Patient Instructions (Addendum)
We have discussed the diagnosis of Alpha1 antitrypsin deficiency Continue using the spiriva  We will refer you to GI for evaluation of liver diease  Return in 3 months

## 2017-03-05 ENCOUNTER — Encounter: Payer: Self-pay | Admitting: Gastroenterology

## 2017-03-05 ENCOUNTER — Ambulatory Visit (INDEPENDENT_AMBULATORY_CARE_PROVIDER_SITE_OTHER): Payer: Medicaid Other | Admitting: Gastroenterology

## 2017-03-05 ENCOUNTER — Other Ambulatory Visit: Payer: Medicaid Other

## 2017-03-05 VITALS — BP 114/76 | HR 82 | Ht 66.0 in | Wt 190.0 lb

## 2017-03-05 DIAGNOSIS — E8801 Alpha-1-antitrypsin deficiency: Secondary | ICD-10-CM

## 2017-03-05 DIAGNOSIS — J439 Emphysema, unspecified: Secondary | ICD-10-CM | POA: Diagnosis not present

## 2017-03-05 DIAGNOSIS — R197 Diarrhea, unspecified: Secondary | ICD-10-CM

## 2017-03-05 DIAGNOSIS — R140 Abdominal distension (gaseous): Secondary | ICD-10-CM

## 2017-03-05 DIAGNOSIS — R7989 Other specified abnormal findings of blood chemistry: Secondary | ICD-10-CM

## 2017-03-05 DIAGNOSIS — R945 Abnormal results of liver function studies: Principal | ICD-10-CM

## 2017-03-05 MED ORDER — DICYCLOMINE HCL 10 MG PO CAPS: 10.0000 mg | ORAL_CAPSULE | Freq: Three times a day (TID) | ORAL | 2 refills | Status: DC | PRN

## 2017-03-05 NOTE — Patient Instructions (Addendum)
If you are age 44 or older, your body mass index should be between 23-30. Your Body mass index is 30.67 kg/m. If this is out of the aforementioned range listed, please consider follow up with your Primary Care Provider.  If you are age 31 or younger, your body mass index should be between 19-25. Your Body mass index is 30.67 kg/m. If this is out of the aformentioned range listed, please consider follow up with your Primary Care Provider.   Your physician has requested that you go to the basement for lab work before leaving today.     Food Guidelines for a sensitive stomach  Many people have difficulty digesting certain foods, causing a variety of distressing and embarrassing symptoms such as abdominal pain, bloating and gas.  These foods may need to be avoided or consumed in small amounts.  Here are some tips that might be helpful for you.  1.   Lactose intolerance is the difficulty or complete inability to digest lactose, the natural sugar in milk and anything made from milk.  This condition is harmless, common, and can begin any time during life.  Some people can digest a modest amount of lactose while others cannot tolerate any.  Also, not all dairy products contain equal amounts of lactose.  For example, hard cheeses such as parmesan have less lactose than soft cheeses such as cheddar.  Yogurt has less lactose than milk or cheese.  Many packaged foods (even many brands of bread) have milk, so read ingredient lists carefully.  It is difficult to test for lactose intolerance, so just try avoiding lactose as much as possible for a week and see what happens with your symptoms.  If you seem to be lactose intolerant, the best plan is to avoid it (but make sure you get calcium from another source).  The next best thing is to use lactase enzyme supplements, available over the counter everywhere.  Just know that many lactose intolerant people need to take several tablets with each serving of dairy to avoid  symptoms.  Lastly, a lot of restaurant food is made with milk or butter.  Many are things you might not suspect, such as mashed potatoes, rice and pasta (cooked with butter) and "grilled" items.  If you are lactose intolerant, it never hurts to ask your server what has milk or butter.  2.   Fiber is an important part of your diet, but not all fiber is well-tolerated.  Insoluble fiber such as bran is often consumed by normal gut bacteria and converted into gas.  Soluble fiber such as oats, squash, carrots and green beans are typically tolerated better.  3.   Some types of carbohydrates can be poorly digested.  Examples include: fructose (apples, cherries, pears, raisins and other dried fruits), fructans (onions, zucchini, large amounts of wheat), sorbitol/mannitol/xylitol and sucralose/Splenda (common artificial sweeteners), and raffinose (lentils, broccoli, cabbage, asparagus, brussel sprouts, many types of beans).  Do a Programmer, multimedia for [employer] and you will find helpful information. Beano, a dietary supplement, will often help with raffinose-containing foods.  As with lactase tablets, you may need several per serving.  4.   Whenever possible, avoid processed food&meats and chemical additives.  High fructose corn syrup, a common sweetener, may be difficult to digest.  Eggs and soy (comes from the soybean, and added to many foods now) are the other most common bloating/gassy foods.  - Dr. Sherlynn [facility]

## 2017-03-05 NOTE — Progress Notes (Signed)
[facility] Consult Note:  History: Evelyn Kline 03/05/2017  Referring physician: Benito Mccreedy, MD     Richardson Landry, MD (Pulmonary)  Reason for consult/chief complaint: Alpha 1 antitrypsin deficiency (Pt states PCP referred because"liver levels were elevated". No GI symptoms)   Subjective  HPI:  This is a 46 year old woman referred by Dr. Learta Codding of pulmonary medicine regarding abnormal LFTs in the setting of alpha-1 antitrypsin deficiency. I reviewed Dr. Rogue Jury recent clinic note, and the patient is a somewhat limited historian. I also reviewed notes from an EGD encounter in February. At that time the patient was seen for several days of crampy abdominal pain with diarrhea that was felt likely to be infectious. Her AST/ALT and lipase were all minimally elevated. She was diagnosed with COPD last year, and had just secondhand smoke exposure as a possible cause. Her alpha-1 antitrypsin level was low at 77, she has a heterozygous phenotype. Pulmonary did not feel she needed infusion therapy. She describes perhaps several months of intermittent sharp periumbilical abdominal discomfort that might last for 10 or 15 minutes. She also tends toward semi-formed to loose stools a few times a day with some urgency. She had an episode of some blood on the paper during the infectious illness in February, none since. She reports having a colonoscopy with Dr. Michail Sermon of [facility] about 2 years ago. Those reports are not available. She recalls that was normal, and that was being done for diarrhea. She is concerned because she has been gaining weight and feeling bloated over the last several months. She has been on antidepressant therapy for between 4 and 6 months.  ROS:  Review of Systems  Constitutional: Positive for unexpected weight change. Negative for appetite change.  HENT: Negative for mouth sores and voice change.   Eyes: Negative for pain and redness.  Respiratory:  Negative for cough and shortness of breath.   Cardiovascular: Negative for chest pain and palpitations.  Gastrointestinal: Positive for abdominal distention.  Genitourinary: Negative for dysuria and hematuria.  Musculoskeletal: Negative for arthralgias and myalgias.  Skin: Negative for pallor and rash.  Neurological: Negative for weakness and headaches.  Hematological: Negative for adenopathy.  Psychiatric/Behavioral: Positive for dysphoric mood.     Past Medical History: Past Medical History:  Diagnosis Date  . Anemia   . Bowel obstruction ([place]) 2012  . COPD (chronic obstructive pulmonary disease) ([place])   . Depression   . History of blood transfusion      Past Surgical History: Past Surgical History:  Procedure Laterality Date  . ABDOMINAL HYSTERECTOMY    . LACERATION REPAIR Right    arm with artery involvement  . ORIF ANKLE FRACTURE Left 09/04/2015   Procedure: OPEN REDUCTION INTERNAL FIXATION (ORIF) LEFT BIOMALLEOLAR FRACTURE;  Surgeon: Wylene Simmer, MD;  Location: [facility];  Service: Orthopedics;  Laterality: Left;  . SMALL INTESTINE SURGERY  2012  . TUBAL LIGATION    . VAGINAL DELIVERY     x3, epidural anesth. with 2 of them  . WISDOM TOOTH EXTRACTION     She reports having had a bowel obstruction after her hysterectomy  Family History: Family History  Problem Relation Age of Onset  . Diabetes Father   . Diabetes Other   . Hypertension Other   . COPD Paternal Grandmother   . COPD Maternal Aunt     Social History: Social History   Social History  . Marital status: Married    Spouse name: N/A  . Number of children: N/A  .  Years of education: N/A   Occupational History  . Security guard    Social History Main Topics  . Smoking status: Never Smoker  . Smokeless tobacco: Never Used  . Alcohol use No  . Drug use: No  . Sexual activity: Not Asked   Other Topics Concern  . None   Social History Narrative   Married, lives with spouse and grandkids   3  children   OCCUPATION: security and CNA    Allergies: No Known Allergies  Outpatient Meds: Current Outpatient Prescriptions  Medication Sig Dispense Refill  . albuterol (PROVENTIL HFA;VENTOLIN HFA) 108 (90 Base) MCG/ACT inhaler Inhale 1-2 puffs into the lungs every 6 (six) hours as needed for wheezing or shortness of breath. 1 Inhaler 3  . mirtazapine (REMERON) 15 MG tablet Take 15 mg by mouth at bedtime.    Marland Kitchen OLANZapine-FLUoxetine (SYMBYAX) 6-25 MG capsule Take 1 capsule by mouth at bedtime.    . Tiotropium Bromide Monohydrate (SPIRIVA RESPIMAT) 2.5 MCG/ACT AERS Inhale 2 puffs into the lungs daily. 1 Inhaler 5  . traZODone (DESYREL) 50 MG tablet Take 50 mg by mouth at bedtime as needed. for sleep  1  . dicyclomine (BENTYL) 10 MG capsule Take 1 capsule (10 mg total) by mouth 3 (three) times daily with meals as needed for spasms. 60 capsule 2   No current facility-administered medications for this visit.       ___________________________________________________________________ Objective   Exam:  BP 114/76   Pulse 82   Ht '5\' 6"'  (1.676 m)   Wt 190 lb (86.2 kg)   BMI 30.67 kg/m    General: this is a(n) Overweight and otherwise well-appearing woman with a decreased affect   Eyes: sclera anicteric, no redness  ENT: oral mucosa moist without lesions, no cervical or supraclavicular lymphadenopathy, good dentition  CV: RRR without murmur, S1/S2, no JVD, no peripheral edema  Resp: clear to auscultation bilaterally, normal RR and effort noted  GI: soft, no tenderness, with active bowel sounds. No guarding or palpable organomegaly noted.  Skin; warm and dry, no rash or jaundice noted  Neuro: awake, alert and oriented x 3. Normal gross motor function and fluent speech  Labs:  CMP Latest Ref Rng & Units 12/20/2016 09/30/2016 09/27/2016  Glucose 65 - 99 mg/dL 125(H) 145(H) 139(H)  BUN 6 - 20 mg/dL '7 13 7  ' Creatinine 0.44 - 1.00 mg/dL 0.68 0.67 0.59  Sodium 135 - 145 mmol/L  137 140 137  Potassium 3.5 - 5.1 mmol/L 3.5 3.5 4.0  Chloride 101 - 111 mmol/L 106 108 111  CO2 22 - 32 mmol/L '22 27 22  ' Calcium 8.9 - 10.3 mg/dL 9.1 8.3(L) 8.9  Total Protein 6.5 - 8.1 g/dL 7.5 - 7.2  Total Bilirubin 0.3 - 1.2 mg/dL 0.8 - 0.9  Alkaline Phos 38 - 126 U/L 126 - 119  AST 15 - 41 U/L 60(H) - 24  ALT 14 - 54 U/L 60(H) - 28  on 12/20/16, Lipase  = 54  In 2013, AST 51, ALT 54,  Alk Phos 157, T bili 0.6 A1At = 77,  Phenotype PiMZ   CBC Latest Ref Rng & Units 12/20/2016 09/30/2016 09/28/2016  WBC 4.0 - 10.5 K/uL 5.8 12.1(H) 17.7(H)  Hemoglobin 12.0 - 15.0 g/dL 14.5 14.1 14.0  Hematocrit 36.0 - 46.0 % 42.1 41.6 39.9  Platelets 150 - 400 K/uL 227 224 228    Radiologic Studies:  See CTA 09/2016 - emyphsematous changes.  No reported liver abnormality  Assessment:  Encounter Diagnoses  Name Primary?  Marland Kitchen LFT elevation Yes  . Abdominal bloating   . Diarrhea, unspecified type   . Alpha-1-antitrypsin deficiency ([place])   . Pulmonary emphysema, unspecified emphysema type ([place])     Her mild LFT abnormality is nonspecific and could've been related to acute infectious illness. LFTs were normal last November, and a mild elevation 5 years ago. I do not think that is due to underlying alpha 1 antitrypsin deficiency, and there is no evidence of hepatic parenchymal abnormality on the CT angiogram of the chest that also imaged the liver. Her digestive symptoms are most consistent with IBS. Reassured that she had a normal colonoscopy 2 years ago. Plan:  Dietary advice and a trial of Bentyl for IBS Hepatitis B and C serologies to be complete for workup of mild transaminitis.  Call us if symptoms worsen  Thank you for the courtesy of this consult.  Please call me with any questions or concerns.  Nelida Meuse III  CCBenito Mccreedy, MD

## 2017-03-06 LAB — HEPATITIS B SURFACE ANTIGEN: Hepatitis B Surface Ag: NEGATIVE

## 2017-03-06 LAB — HEPATITIS C ANTIBODY: HCV AB: NEGATIVE

## 2017-03-06 LAB — HEPATITIS B SURFACE ANTIBODY,QUALITATIVE

## 2017-03-06 LAB — HEPATITIS B CORE ANTIBODY, TOTAL: HEP B C TOTAL AB: NONREACTIVE

## 2017-06-02 ENCOUNTER — Ambulatory Visit: Payer: Medicaid Other | Admitting: Pulmonary Disease

## 2017-12-31 ENCOUNTER — Other Ambulatory Visit: Payer: Self-pay

## 2017-12-31 ENCOUNTER — Encounter: Payer: Self-pay | Admitting: Physician Assistant

## 2017-12-31 ENCOUNTER — Ambulatory Visit (INDEPENDENT_AMBULATORY_CARE_PROVIDER_SITE_OTHER): Payer: BLUE CROSS/BLUE SHIELD | Admitting: Physician Assistant

## 2017-12-31 VITALS — BP 130/98 | HR 80 | Temp 98.6°F | Resp 18 | Ht 66.0 in | Wt 183.0 lb

## 2017-12-31 DIAGNOSIS — J069 Acute upper respiratory infection, unspecified: Secondary | ICD-10-CM

## 2017-12-31 DIAGNOSIS — R42 Dizziness and giddiness: Secondary | ICD-10-CM | POA: Diagnosis not present

## 2017-12-31 DIAGNOSIS — R070 Pain in throat: Secondary | ICD-10-CM

## 2017-12-31 LAB — POC INFLUENZA A&B (BINAX/QUICKVUE)
Influenza A, POC: NEGATIVE
Influenza B, POC: NEGATIVE

## 2017-12-31 MED ORDER — IPRATROPIUM BROMIDE 0.03 % NA SOLN: 2.0000 | Freq: Two times a day (BID) | NASAL | 0 refills | Status: DC

## 2017-12-31 MED ORDER — BENZONATATE 100 MG PO CAPS: 100.0000 mg | ORAL_CAPSULE | Freq: Three times a day (TID) | ORAL | 0 refills | Status: DC | PRN

## 2017-12-31 MED ORDER — GUAIFENESIN ER 1200 MG PO TB12: 1.0000 | ORAL_TABLET | Freq: Two times a day (BID) | ORAL | 1 refills | Status: DC | PRN

## 2017-12-31 NOTE — Patient Instructions (Addendum)
Please take medication as prescribed.  Please make sure you are hydrating well.   I will follow up with the results of your blood work.   Viral Respiratory Infection A viral respiratory infection is an illness that affects parts of the body used for breathing, like the lungs, nose, and throat. It is caused by a germ called a virus. Some examples of this kind of infection are:  A cold.  The flu (influenza).  A respiratory syncytial virus (RSV) infection.  How do I know if I have this infection? Most of the time this infection causes:  A stuffy or runny nose.  Yellow or green fluid in the nose.  A cough.  Sneezing.  Tiredness (fatigue).  Achy muscles.  A sore throat.  Sweating or chills.  A fever.  A headache.  How is this infection treated? If the flu is diagnosed early, it may be treated with an antiviral medicine. This medicine shortens the length of time a person has symptoms. Symptoms may be treated with over-the-counter and prescription medicines, such as:  Expectorants. These make it easier to cough up mucus.  Decongestant nasal sprays.  Doctors do not prescribe antibiotic medicines for viral infections. They do not work with this kind of infection. How do I know if I should stay home? To keep others from getting sick, stay home if you have:  A fever.  A lasting cough.  A sore throat.  A runny nose.  Sneezing.  Muscles aches.  Headaches.  Tiredness.  Weakness.  Chills.  Sweating.  An upset stomach (nausea).  Follow these instructions at home:  Rest as much as possible.  Take over-the-counter and prescription medicines only as told by your doctor.  Drink enough fluid to keep your pee (urine) clear or pale yellow.  Gargle with salt water. Do this 3-4 times per day or as needed. To make a salt-water mixture, dissolve -1 tsp of salt in 1 cup of warm water. Make sure the salt dissolves all the way.  Use nose drops made from salt  water. This helps with stuffiness (congestion). It also helps soften the skin around your nose.  Do not drink alcohol.  Do not use tobacco products, including cigarettes, chewing tobacco, and e-cigarettes. If you need help quitting, ask your doctor. Get help if:  Your symptoms last for 10 days or longer.  Your symptoms get worse over time.  You have a fever.  You have very bad pain in your face or forehead.  Parts of your jaw or neck become very swollen. Get help right away if:  You feel pain or pressure in your chest.  You have shortness of breath.  You faint or feel like you will faint.  You keep throwing up (vomiting).  You feel confused. This information is not intended to replace advice given to you by your health care provider. Make sure you discuss any questions you have with your health care provider. Document Released: 10/03/2008 Document Revised: 03/28/2016 Document Reviewed: 03/29/2015 Elsevier Interactive Patient Education  2018 [employer].   IF you received an x-ray today, you will receive an invoice from [facility]. Please contact [facility] at [phone] with questions or concerns regarding your invoice.   IF you received labwork today, you will receive an invoice from [facility]. Please contact [facility] at [phone] with questions or concerns regarding your invoice.   Our billing staff will not be able to assist you with questions regarding bills from these companies.  You will  be contacted with the lab results as soon as they are available. The fastest way to get your results is to activate your My Chart account. Instructions are located on the last page of this paperwork. If you have not heard from [place] regarding the results in 2 weeks, please contact this office.

## 2017-12-31 NOTE — Progress Notes (Signed)
PRIMARY CARE AT [facility] [address] [place] [address] [phone]  Date:  12/31/2017   Name:  Evelyn Kline   DOB:  Mar 19, 1971   MRN:  [id number]  PCP:  Patient, No Pcp Per    History of Present Illness:  Evelyn Kline is a 47 y.o. female patient who presents to PCP with  Chief Complaint  Patient presents with  . Cough    x2 days, pt states she is experiencing a cough, runny nose, headaches, dizziness, and diarreha. Pt states she has not noticed any fevers but had body aches and chills.     Patient is here for 2 days of cough.  This is not productive.  Runny nose and headache are also present.  She denies fever.  She has noted slight wheezing. She does have a hx of copd.  She is using her inhaler which helps, but can not report extra use.  She also has diarrhea which she notes apparent. She states she is also dizzy.  When asked when this started--this has been for 6 months.  She has not reported this.  She notes no heavy bleeding.  She notes that she will feel some palpitations.  No chest pains.  This has never been evaluated by a cardiologist.  Hx of alpha 1 anti-trypsin deficiency, anemia, and copd.    Patient Active Problem List   Diagnosis Date Noted  . Alpha-1-antitrypsin deficiency (H[facility]) 03/03/2017  . COPD (chronic obstructive pulmonary disease) (H[facility]) 09/27/2016  . Wheezing-associated respiratory infection (WARI) 09/26/2016  . COPD (chronic obstructive pulmonary disease) with emphysema ([facility]) 09/26/2016  . Bimalleolar fracture 09/04/2015    Past Medical History:  Diagnosis Date  . Anemia   . Bowel obstruction ([facility]) 2012  . COPD (chronic obstructive pulmonary disease) ([facility])   . Depression   . History of blood transfusion     Past Surgical History:  Procedure Laterality Date  . ABDOMINAL HYSTERECTOMY    . LACERATION REPAIR Right    arm with artery involvement  . ORIF ANKLE FRACTURE Left 09/04/2015   Procedure: OPEN REDUCTION INTERNAL FIXATION (ORIF) LEFT  BIOMALLEOLAR FRACTURE;  Surgeon: Toni Arthurs, MD;  Location: [facility];  Service: Orthopedics;  Laterality: Left;  . SMALL INTESTINE SURGERY  2012  . TUBAL LIGATION    . VAGINAL DELIVERY     x3, epidural anesth. with 2 of them  . WISDOM TOOTH EXTRACTION      Social History   Tobacco Use  . Smoking status: Never Smoker  . Smokeless tobacco: Never Used  Substance Use Topics  . Alcohol use: No  . Drug use: No    Family History  Problem Relation Age of Onset  . Diabetes Father   . Diabetes Other   . Hypertension Other   . COPD Paternal Grandmother   . COPD Maternal Aunt     No Known Allergies  Medication list has been reviewed and updated.  Current Outpatient Medications on File Prior to Visit  Medication Sig Dispense Refill  . albuterol (PROVENTIL HFA;VENTOLIN HFA) 108 (90 Base) MCG/ACT inhaler Inhale 1-2 puffs into the lungs every 6 (six) hours as needed for wheezing or shortness of breath. 1 Inhaler 3  . dicyclomine (BENTYL) 10 MG capsule Take 1 capsule (10 mg total) by mouth 3 (three) times daily with meals as needed for spasms. 60 capsule 2  . mirtazapine (REMERON) 15 MG tablet Take 15 mg by mouth at bedtime.    Marland Kitchen OLANZapine-FLUoxetine (SYMBYAX) 6-25 MG capsule  Take 1 capsule by mouth at bedtime.    . Tiotropium Bromide Monohydrate (SPIRIVA RESPIMAT) 2.5 MCG/ACT AERS Inhale 2 puffs into the lungs daily. 1 Inhaler 5  . traZODone (DESYREL) 50 MG tablet Take 50 mg by mouth at bedtime as needed. for sleep  1   No current facility-administered medications on file prior to visit.     ROS ROS otherwise unremarkable unless listed above.  Physical Examination: BP (!) 130/98 (BP Location: Right Arm, Patient Position: Sitting, Cuff Size: Large)   Pulse 80   Temp 98.6 F (37 C) (Oral)   Resp 18   Ht 5\' 6"  (1.676 m)   Wt 183 lb (83 kg)   SpO2 98%   BMI 29.54 kg/m  Ideal Body Weight: Weight in (lb) to have BMI = 25: 154.6  Physical Exam  Constitutional: She is oriented to  person, place, and time. She appears well-developed and well-nourished. No distress.  HENT:  Head: Normocephalic and atraumatic.  Right Ear: Tympanic membrane, external ear and ear canal normal.  Left Ear: Tympanic membrane, external ear and ear canal normal.  Nose: Mucosal edema and rhinorrhea present. Right sinus exhibits no maxillary sinus tenderness and no frontal sinus tenderness. Left sinus exhibits no maxillary sinus tenderness and no frontal sinus tenderness.  Mouth/Throat: No uvula swelling. No oropharyngeal exudate, posterior oropharyngeal edema or posterior oropharyngeal erythema.  Eyes: Conjunctivae and EOM are normal. Pupils are equal, round, and reactive to light.  Cardiovascular: Normal rate and regular rhythm. Exam reveals no gallop, no distant heart sounds and no friction rub.  No murmur heard. Pulmonary/Chest: Effort normal. No respiratory distress. She has no decreased breath sounds. She has no wheezes. She has no rhonchi.  Lymphadenopathy:       Head (right side): No submandibular, no tonsillar, no preauricular and no posterior auricular adenopathy present.       Head (left side): No submandibular, no tonsillar, no preauricular and no posterior auricular adenopathy present.  Neurological: She is alert and oriented to person, place, and time.  Skin: She is not diaphoretic.  Psychiatric: She has a normal mood and affect. Her behavior is normal.    Results for orders placed or performed in visit on 12/31/17  POC Influenza A&B(BINAX/QUICKVUE)  Result Value Ref Range   Influenza A, POC Negative Negative   Influenza B, POC Negative Negative     Assessment and Plan: Evelyn Kline is a 47 y.o. female who is here today for cc of  Chief Complaint  Patient presents with  . Cough    x2 days, pt states she is experiencing a cough, runny nose, headaches, dizziness, and diarreha. Pt states she has not noticed any fevers but had body aches and chills.  --normal sinus rhythm.  I  will refer her to cardiology at this time, given her hx.  possibility that she is not treating the copd appropriately.   --last visit with pulmonology she appeared non-compliant.  possibly  Acute upper respiratory infection - Plan: ipratropium (ATROVENT) 0.03 % nasal spray, benzonatate (TESSALON) 100 MG capsule, Guaifenesin (MUCINEX MAXIMUM STRENGTH) 1200 MG TB12  Throat pain - Plan: POC Influenza A&B(BINAX/QUICKVUE), EKG 12-Lead, CBC, Basic metabolic panel  Dizziness - Plan: EKG 12-Lead, CBC, Basic metabolic panel  Trena Platt, PA-C [facility] 3/5/20198:45 AM

## 2018-01-01 LAB — CBC
Hematocrit: 45.7 % (ref 34.0–46.6)
Hemoglobin: 15.6 g/dL (ref 11.1–15.9)
MCH: 28.6 pg (ref 26.6–33.0)
MCHC: 34.1 g/dL (ref 31.5–35.7)
MCV: 84 fL (ref 79–97)
PLATELETS: 239 10*3/uL (ref 150–379)
RBC: 5.45 x10E6/uL — ABNORMAL HIGH (ref 3.77–5.28)
RDW: 14.2 % (ref 12.3–15.4)
WBC: 4.8 10*3/uL (ref 3.4–10.8)

## 2018-01-01 LAB — BASIC METABOLIC PANEL
BUN/Creatinine Ratio: 9 (ref 9–23)
BUN: 6 mg/dL (ref 6–24)
CHLORIDE: 104 mmol/L (ref 96–106)
CO2: 22 mmol/L (ref 20–29)
Calcium: 9.1 mg/dL (ref 8.7–10.2)
Creatinine, Ser: 0.7 mg/dL (ref 0.57–1.00)
GFR calc Af Amer: 122 mL/min/{1.73_m2} (ref >59)
GFR calc non Af Amer: 106 mL/min/{1.73_m2} (ref >59)
GLUCOSE: 90 mg/dL (ref 65–99)
Potassium: 3.7 mmol/L (ref 3.5–5.2)
SODIUM: 141 mmol/L (ref 134–144)

## 2018-01-05 ENCOUNTER — Encounter: Payer: Self-pay | Admitting: Physician Assistant

## 2018-01-05 ENCOUNTER — Ambulatory Visit (INDEPENDENT_AMBULATORY_CARE_PROVIDER_SITE_OTHER): Payer: BLUE CROSS/BLUE SHIELD | Admitting: Physician Assistant

## 2018-01-05 VITALS — BP 118/86 | HR 87 | Temp 98.4°F | Resp 16 | Ht 66.0 in | Wt 183.0 lb

## 2018-01-05 DIAGNOSIS — J988 Other specified respiratory disorders: Secondary | ICD-10-CM

## 2018-01-05 DIAGNOSIS — J209 Acute bronchitis, unspecified: Secondary | ICD-10-CM | POA: Diagnosis not present

## 2018-01-05 DIAGNOSIS — B9789 Other viral agents as the cause of diseases classified elsewhere: Secondary | ICD-10-CM

## 2018-01-05 MED ORDER — HYDROCOD POLST-CPM POLST ER 10-8 MG/5ML PO SUER: 5.0000 mL | Freq: Every evening | ORAL | 0 refills | Status: DC | PRN

## 2018-01-05 MED ORDER — PREDNISONE 20 MG PO TABS
40.0000 mg | ORAL_TABLET | Freq: Every day | ORAL | 0 refills | Status: DC
Start: 2018-01-05 — End: 2018-10-05

## 2018-01-05 NOTE — Patient Instructions (Signed)
Please start the prednisone in the morning with breakfast.   I would like you to increase your hydration to 6-8 cups of water per day.  This thins out the mucus.    Viral Respiratory Infection A viral respiratory infection is an illness that affects parts of the body used for breathing, like the lungs, nose, and throat. It is caused by a germ called a virus. Some examples of this kind of infection are:  A cold.  The flu (influenza).  A respiratory syncytial virus (RSV) infection.  How do I know if I have this infection? Most of the time this infection causes:  A stuffy or runny nose.  Yellow or green fluid in the nose.  A cough.  Sneezing.  Tiredness (fatigue).  Achy muscles.  A sore throat.  Sweating or chills.  A fever.  A headache.  How is this infection treated? If the flu is diagnosed early, it may be treated with an antiviral medicine. This medicine shortens the length of time a person has symptoms. Symptoms may be treated with over-the-counter and prescription medicines, such as:  Expectorants. These make it easier to cough up mucus.  Decongestant nasal sprays.  Doctors do not prescribe antibiotic medicines for viral infections. They do not work with this kind of infection. How do I know if I should stay home? To keep others from getting sick, stay home if you have:  A fever.  A lasting cough.  A sore throat.  A runny nose.  Sneezing.  Muscles aches.  Headaches.  Tiredness.  Weakness.  Chills.  Sweating.  An upset stomach (nausea).  Follow these instructions at home:  Rest as much as possible.  Take over-the-counter and prescription medicines only as told by your doctor.  Drink enough fluid to keep your pee (urine) clear or pale yellow.  Gargle with salt water. Do this 3-4 times per day or as needed. To make a salt-water mixture, dissolve -1 tsp of salt in 1 cup of warm water. Make sure the salt dissolves all the way.  Use nose  drops made from salt water. This helps with stuffiness (congestion). It also helps soften the skin around your nose.  Do not drink alcohol.  Do not use tobacco products, including cigarettes, chewing tobacco, and e-cigarettes. If you need help quitting, ask your doctor. Get help if:  Your symptoms last for 10 days or longer.  Your symptoms get worse over time.  You have a fever.  You have very bad pain in your face or forehead.  Parts of your jaw or neck become very swollen. Get help right away if:  You feel pain or pressure in your chest.  You have shortness of breath.  You faint or feel like you will faint.  You keep throwing up (vomiting).  You feel confused. This information is not intended to replace advice given to you by your health care provider. Make sure you discuss any questions you have with your health care provider. Document Released: 10/03/2008 Document Revised: 03/28/2016 Document Reviewed: 03/29/2015 Elsevier Interactive Patient Education  2018 [employer].

## 2018-01-05 NOTE — Progress Notes (Signed)
PRIMARY CARE AT [facility] [address], [place] [address] [phone]  Date:  01/05/2018   Name:  Evelyn Kline   DOB:  03/25/1971   MRN:  [id number]  PCP:  Patient, No Pcp Per    History of Present Illness:  Evelyn Kline is a 47 y.o. female patient who presents to PCP with  Chief Complaint  Patient presents with  . URI    follow up on upper resp infection/ pt states she still has a cough     Patient is here today after 1 week of UR symptoms.  She came in 2 days after her symptoms started.  This appeared to be an upper respiratory infection of viral etiology, and given supportive treatment.  Today she reports that overall the cough is improving, but still present.  She had nasal congestion but this has improved, along with hoarseness that has improved.  Cough is non-productive.  She is using her inhaler as usual, from what she reports.  No dyspnea or sob.  She reports that the cough is worse at night.  Last night she coughed to the point of emesis, non-bloody or bilious.  No heartburn.  No fever.   Patient Active Problem List   Diagnosis Date Noted  . Alpha-1-antitrypsin deficiency (HCC) 03/03/2017  . COPD (chronic obstructive pulmonary disease) (HCC) 09/27/2016  . Wheezing-associated respiratory infection (WARI) 09/26/2016  . COPD (chronic obstructive pulmonary disease) with emphysema (HCC) 09/26/2016  . Bimalleolar fracture 09/04/2015    Past Medical History:  Diagnosis Date  . Anemia   . Bowel obstruction (HCC) 2012  . COPD (chronic obstructive pulmonary disease) (HCC)   . Depression   . History of blood transfusion     Past Surgical History:  Procedure Laterality Date  . ABDOMINAL HYSTERECTOMY    . LACERATION REPAIR Right    arm with artery involvement  . ORIF ANKLE FRACTURE Left 09/04/2015   Procedure: OPEN REDUCTION INTERNAL FIXATION (ORIF) LEFT BIOMALLEOLAR FRACTURE;  Surgeon: Toni Arthurs, MD;  Location: [facility] OR;  Service: Orthopedics;  Laterality: Left;  . SMALL  INTESTINE SURGERY  2012  . TUBAL LIGATION    . VAGINAL DELIVERY     x3, epidural anesth. with 2 of them  . WISDOM TOOTH EXTRACTION      Social History   Tobacco Use  . Smoking status: Never Smoker  . Smokeless tobacco: Never Used  Substance Use Topics  . Alcohol use: No  . Drug use: No    Family History  Problem Relation Age of Onset  . Diabetes Father   . Diabetes Other   . Hypertension Other   . COPD Paternal Grandmother   . COPD Maternal Aunt     No Known Allergies  Medication list has been reviewed and updated.  Current Outpatient Medications on File Prior to Visit  Medication Sig Dispense Refill  . albuterol (PROVENTIL HFA;VENTOLIN HFA) 108 (90 Base) MCG/ACT inhaler Inhale 1-2 puffs into the lungs every 6 (six) hours as needed for wheezing or shortness of breath. 1 Inhaler 3  . benzonatate (TESSALON) 100 MG capsule Take 1-2 capsules (100-200 mg total) by mouth 3 (three) times daily as needed for cough. 40 capsule 0  . dicyclomine (BENTYL) 10 MG capsule Take 1 capsule (10 mg total) by mouth 3 (three) times daily with meals as needed for spasms. 60 capsule 2  . Guaifenesin (MUCINEX MAXIMUM STRENGTH) 1200 MG TB12 Take 1 tablet (1,200 mg total) by mouth every 12 (twelve) hours as  needed. 14 tablet 1  . ipratropium (ATROVENT) 0.03 % nasal spray Place 2 sprays into both nostrils 2 (two) times daily. 30 mL 0  . mirtazapine (REMERON) 15 MG tablet Take 15 mg by mouth at bedtime.    Marland Kitchen OLANZapine-FLUoxetine (SYMBYAX) 6-25 MG capsule Take 1 capsule by mouth at bedtime.    . Tiotropium Bromide Monohydrate (SPIRIVA RESPIMAT) 2.5 MCG/ACT AERS Inhale 2 puffs into the lungs daily. 1 Inhaler 5  . traZODone (DESYREL) 50 MG tablet Take 50 mg by mouth at bedtime as needed. for sleep  1   No current facility-administered medications on file prior to visit.     ROS ROS otherwise unremarkable unless listed above.  Physical Examination: BP 118/86   Pulse 87   Temp 98.4 F (36.9 C)  (Oral)   Resp 16   Ht 5\' 6"  (1.676 m)   Wt 183 lb (83 kg)   SpO2 97%   BMI 29.54 kg/m  Ideal Body Weight: Weight in (lb) to have BMI = 25: 154.6  Physical Exam  Constitutional: She is oriented to person, place, and time. She appears well-developed and well-nourished. No distress.  HENT:  Head: Normocephalic and atraumatic.  Right Ear: Tympanic membrane, external ear and ear canal normal.  Left Ear: Tympanic membrane, external ear and ear canal normal.  Nose: No mucosal edema or rhinorrhea. Right sinus exhibits no maxillary sinus tenderness and no frontal sinus tenderness. Left sinus exhibits no maxillary sinus tenderness and no frontal sinus tenderness.  Mouth/Throat: No uvula swelling. No oropharyngeal exudate, posterior oropharyngeal edema or posterior oropharyngeal erythema.  Eyes: Conjunctivae and EOM are normal. Pupils are equal, round, and reactive to light.  Cardiovascular: Normal rate, regular rhythm, normal heart sounds and intact distal pulses. Exam reveals no gallop, no distant heart sounds and no friction rub.  No murmur heard. Pulmonary/Chest: Effort normal and breath sounds normal. No respiratory distress. She has no decreased breath sounds. She has no wheezes. She has no rhonchi.  Lymphadenopathy:       Head (right side): No submandibular, no tonsillar, no preauricular and no posterior auricular adenopathy present.       Head (left side): No submandibular, no tonsillar, no preauricular and no posterior auricular adenopathy present.  Neurological: She is alert and oriented to person, place, and time.  Skin: She is not diaphoretic.  Psychiatric: She has a normal mood and affect. Her behavior is normal.     Assessment and Plan: Evelyn Kline is a 47 y.o. female who is here today for cc of  Chief Complaint  Patient presents with  . URI    follow up on upper resp infection/ pt states she still has a cough  will perform quick burst of prednisone which she will start  tomorrow. No success with tessalon Perles, will give tussionex.  Precautions advised of sedation.   Acute bronchitis, unspecified organism - Plan: predniSONE (DELTASONE) 20 MG tablet, chlorpheniramine-HYDROcodone (TUSSIONEX PENNKINETIC ER) 10-8 MG/5ML SUER  Viral respiratory illness - Plan: predniSONE (DELTASONE) 20 MG tablet, chlorpheniramine-HYDROcodone (TUSSIONEX PENNKINETIC ER) 10-8 MG/5ML SUER  Trena Platt, PA-C [facility] 3/4/20193:55 PM

## 2018-01-06 ENCOUNTER — Telehealth: Payer: Self-pay | Admitting: Physician Assistant

## 2018-01-06 DIAGNOSIS — R42 Dizziness and giddiness: Secondary | ICD-10-CM | POA: Diagnosis not present

## 2018-01-06 NOTE — Telephone Encounter (Signed)
Are you able to add a hepatic panel.  Dx dizziness

## 2018-01-06 NOTE — Addendum Note (Signed)
Addended by: Baldwin Crown D on: 01/06/2018 10:07 AM   Modules accepted: Orders

## 2018-01-07 LAB — HEPATIC FUNCTION PANEL
ALK PHOS: 151 IU/L — AB (ref 39–117)
ALT: 39 IU/L — AB (ref 0–32)
AST: 41 IU/L — AB (ref 0–40)
Albumin: 4.2 g/dL (ref 3.5–5.5)
BILIRUBIN TOTAL: 0.4 mg/dL (ref 0.0–1.2)
BILIRUBIN, DIRECT: 0.14 mg/dL (ref 0.00–0.40)
Total Protein: 7.6 g/dL (ref 6.0–8.5)

## 2018-01-13 ENCOUNTER — Encounter: Payer: Self-pay | Admitting: *Deleted

## 2018-01-27 ENCOUNTER — Other Ambulatory Visit: Payer: Self-pay | Admitting: Physician Assistant

## 2018-01-27 DIAGNOSIS — J069 Acute upper respiratory infection, unspecified: Secondary | ICD-10-CM

## 2018-02-03 ENCOUNTER — Encounter: Payer: Self-pay | Admitting: Physician Assistant

## 2018-05-12 DIAGNOSIS — F332 Major depressive disorder, recurrent severe without psychotic features: Secondary | ICD-10-CM | POA: Diagnosis not present

## 2018-07-25 ENCOUNTER — Ambulatory Visit: Payer: BLUE CROSS/BLUE SHIELD | Admitting: Osteopathic Medicine

## 2018-08-07 ENCOUNTER — Ambulatory Visit: Payer: Self-pay | Admitting: *Deleted

## 2018-08-07 ENCOUNTER — Encounter (HOSPITAL_COMMUNITY): Payer: Self-pay | Admitting: *Deleted

## 2018-08-07 ENCOUNTER — Emergency Department (HOSPITAL_COMMUNITY)
Admission: EM | Admit: 2018-08-07 | Discharge: 2018-08-07 | Disposition: A | Discharge status: Home / Self Care | Payer: Self-pay | Attending: Emergency Medicine | Admitting: Emergency Medicine

## 2018-08-07 ENCOUNTER — Other Ambulatory Visit: Payer: Self-pay

## 2018-08-07 DIAGNOSIS — Z79899 Other long term (current) drug therapy: Secondary | ICD-10-CM | POA: Insufficient documentation

## 2018-08-07 DIAGNOSIS — J449 Chronic obstructive pulmonary disease, unspecified: Secondary | ICD-10-CM | POA: Insufficient documentation

## 2018-08-07 DIAGNOSIS — G43809 Other migraine, not intractable, without status migrainosus: Secondary | ICD-10-CM | POA: Insufficient documentation

## 2018-08-07 MED ORDER — KETOROLAC TROMETHAMINE 15 MG/ML IJ SOLN
15.0000 mg | Freq: Once | INTRAMUSCULAR | Status: AC
  Administered 2018-08-07: 15 mg via INTRAVENOUS
  Filled 2018-08-07: qty 1

## 2018-08-07 MED ORDER — DIPHENHYDRAMINE HCL 50 MG/ML IJ SOLN
25.0000 mg | Freq: Once | INTRAMUSCULAR | Status: AC
  Administered 2018-08-07: 25 mg via INTRAVENOUS
  Filled 2018-08-07: qty 1

## 2018-08-07 MED ORDER — SODIUM CHLORIDE 0.9 % IV SOLN
Freq: Once | INTRAVENOUS | Status: AC
  Administered 2018-08-07: 13:00:00 via INTRAVENOUS

## 2018-08-07 MED ORDER — METOCLOPRAMIDE HCL 5 MG/ML IJ SOLN
10.0000 mg | Freq: Once | INTRAMUSCULAR | Status: AC
  Administered 2018-08-07: 10 mg via INTRAVENOUS
  Filled 2018-08-07: qty 2

## 2018-08-07 NOTE — ED Provider Notes (Signed)
[facility]-EMERGENCY DEPT Provider Note   CSN: [id number] Arrival date & time: 08/07/18  1003     History   Chief Complaint Chief Complaint  Patient presents with  . Migraine    HPI Evelyn Kline is a 47 y.o. female.  47 year old female presents with a migraine intermittently for the last 5 months.  Patient states she has been having migraines for the last 5 months every couple of days and then lasts for a couple of days.  She states she doing Tylenol and Motrin over-the-counter with no improvement in her symptoms.  She notes associated blurry vision and dizziness with her migraines.  She describes pain as a pressure/sharp pain behind both eyes and between her eyebrows.  She denies any change in her symptoms today that prompted her to come into the ER.  Patient states she just became tired.  Symptoms and so she chose to call her primary care doctor about this finally.  She states her primary care doctor said she should go to the ER.  Patient denies any fevers, nausea, vomiting, abdominal pain.  Patient denies any chest pain, shortness of breath.  The history is provided by the patient.  Migraine  The current episode started more than 1 week ago. The problem occurs every several days. The problem has not changed since onset.Associated symptoms include headaches. Pertinent negatives include no chest pain, no abdominal pain and no shortness of breath.    Past Medical History:  Diagnosis Date  . Anemia   . Bowel obstruction (HCC) 2012  . COPD (chronic obstructive pulmonary disease) (HCC)   . Depression   . History of blood transfusion     Patient Active Problem List   Diagnosis Date Noted  . Alpha-1-antitrypsin deficiency (HCC) 03/03/2017  . COPD (chronic obstructive pulmonary disease) (HCC) 09/27/2016  . Wheezing-associated respiratory infection (WARI) 09/26/2016  . COPD (chronic obstructive pulmonary disease) with emphysema (HCC) 09/26/2016  . Bimalleolar  fracture 09/04/2015    Past Surgical History:  Procedure Laterality Date  . ABDOMINAL HYSTERECTOMY    . LACERATION REPAIR Right    arm with artery involvement  . ORIF ANKLE FRACTURE Left 09/04/2015   Procedure: OPEN REDUCTION INTERNAL FIXATION (ORIF) LEFT BIOMALLEOLAR FRACTURE;  Surgeon: Toni Arthurs, MD;  Location: [facility] OR;  Service: Orthopedics;  Laterality: Left;  . SMALL INTESTINE SURGERY  2012  . TUBAL LIGATION    . VAGINAL DELIVERY     x3, epidural anesth. with 2 of them  . WISDOM TOOTH EXTRACTION       OB History   None      Home Medications    Prior to Admission medications   Medication Sig Start Date End Date Taking? Authorizing Provider  albuterol (PROVENTIL HFA;VENTOLIN HFA) 108 (90 Base) MCG/ACT inhaler Inhale 1-2 puffs into the lungs every 6 (six) hours as needed for wheezing or shortness of breath. 10/17/16  Yes Mannam, Praveen, MD  FLUoxetine (PROZAC) 20 MG tablet Take 20 mg by mouth daily. 07/16/18  Yes [provider]  hydrOXYzine (ATARAX/VISTARIL) 10 MG tablet Take 10 mg by mouth 3 (three) times daily. 05/12/18  Yes [provider]  benzonatate (TESSALON) 100 MG capsule Take 1-2 capsules (100-200 mg total) by mouth 3 (three) times daily as needed for cough. Patient not taking: Reported on 08/07/2018 12/31/17   Trena Platt D, PA  chlorpheniramine-HYDROcodone (TUSSIONEX PENNKINETIC ER) 10-8 MG/5ML SUER Take 5 mLs by mouth at bedtime as needed. Patient not taking: Reported on 08/07/2018 01/05/18  Trena Platt D, PA  dicyclomine (BENTYL) 10 MG capsule Take 1 capsule (10 mg total) by mouth 3 (three) times daily with meals as needed for spasms. Patient not taking: Reported on 08/07/2018 03/05/17   Sherrilyn Rist, MD  Guaifenesin [facility] MAXIMUM STRENGTH) 1200 MG TB12 Take 1 tablet (1,200 mg total) by mouth every 12 (twelve) hours as needed. Patient not taking: Reported on 08/07/2018 12/31/17   Trena Platt D, PA  ipratropium (ATROVENT) 0.03  % nasal spray PLACE 2 SPRAYS INTO BOTH NOSTRILS 2 TIMES DAILY. Patient not taking: Reported on 08/07/2018 01/27/18   Trena Platt D, PA  predniSONE (DELTASONE) 20 MG tablet Take 2 tablets (40 mg total) by mouth daily with breakfast. Patient not taking: Reported on 08/07/2018 01/05/18   Trena Platt D, PA  Tiotropium Bromide Monohydrate (SPIRIVA RESPIMAT) 2.5 MCG/ACT AERS Inhale 2 puffs into the lungs daily. Patient not taking: Reported on 08/07/2018 12/04/16   Chilton Greathouse, MD    Family History Family History  Problem Relation Age of Onset  . Diabetes Father   . Diabetes Other   . Hypertension Other   . COPD Paternal Grandmother   . COPD Maternal Aunt     Social History Social History   Tobacco Use  . Smoking status: Never Smoker  . Smokeless tobacco: Never Used  Substance Use Topics  . Alcohol use: No  . Drug use: No     Allergies   Patient has no known allergies.   Review of Systems Review of Systems  Constitutional: Negative for chills and fever.  HENT: Negative for rhinorrhea and sore throat.   Eyes: Positive for photophobia and visual disturbance (blurry vision).  Respiratory: Negative for cough and shortness of breath.   Cardiovascular: Negative for chest pain and leg swelling.  Gastrointestinal: Negative for abdominal pain, diarrhea, nausea and vomiting.  Genitourinary: Negative for dysuria, frequency and urgency.  Musculoskeletal: Negative for joint swelling and neck pain.  Skin: Negative for rash and wound.  Neurological: Positive for dizziness and headaches. Negative for syncope and numbness.  All other systems reviewed and are negative.    Physical Exam Updated Vital Signs BP (!) 145/109 (BP Location: Right Arm)   Pulse 72   Temp 97.6 F (36.4 C) (Oral)   Resp 16   SpO2 100%   Physical Exam  Constitutional: She is oriented to person, place, and time. She appears well-developed and well-nourished.  HENT:  Head: Normocephalic and  atraumatic.  Eyes: Conjunctivae and EOM are normal.  Neck: Neck supple.  Cardiovascular: Normal rate, regular rhythm and normal heart sounds.  No murmur heard. Pulmonary/Chest: Effort normal and breath sounds normal. No respiratory distress. She has no wheezes. She has no rales.  Abdominal: Soft. Bowel sounds are normal. She exhibits no distension. There is no tenderness.  Musculoskeletal: Normal range of motion. She exhibits no tenderness or deformity.  Neurological: She is alert and oriented to person, place, and time. No cranial nerve deficit or sensory deficit. She exhibits normal muscle tone. Coordination normal.  Skin: Skin is warm and dry. No rash noted. No erythema.  Psychiatric: She has a normal mood and affect. Her behavior is normal.  Nursing note and vitals reviewed.    ED Treatments / Results  Labs (all labs ordered are listed, but only abnormal results are displayed) Labs Reviewed - No data to display  EKG None  Radiology No results found.  Procedures Procedures (including critical care time)  Medications Ordered in ED Medications  0.9 %  sodium chloride infusion ( Intravenous New Bag/Given 08/07/18 1237)  metoCLOPramide (REGLAN) injection 10 mg (10 mg Intravenous Given 08/07/18 1238)  diphenhydrAMINE (BENADRYL) injection 25 mg (25 mg Intravenous Given 08/07/18 1238)  ketorolac (TORADOL) 15 MG/ML injection 15 mg (15 mg Intravenous Given 08/07/18 1238)     Initial Impression / Assessment and Plan / ED Course  I have reviewed the triage vital signs and the nursing notes.  Pertinent labs & imaging results that were available during my care of the patient were reviewed by me and considered in my medical decision making (see chart for details).     Pt resting comfortably in bed, NAD. VSS. Pt is feeling much better at this time. She states her HA is almost gone. She denies any complaints at this time. Pt HA treated and improved while in ED.  Presentation is like pts  typical HA and non concerning for [facility], ICH, Meningitis, or temporal arteritis. Pt is afebrile with no focal neuro deficits, nuchal rigidity, or change in vision. Pt is to follow up with PCP to discuss prophylactic medication. Pt verbalizes understanding and is agreeable with plan to dc.   At this time there does not appear to be any evidence of an acute emergency medical condition and the patient appears stable for discharge with appropriate outpatient follow up.Diagnosis was discussed with patient who verbalizes understanding and is agreeable to discharge. Pt case discussed with Dr. Rhunette Croft who agrees with my plan.    Final Clinical Impressions(s) / ED Diagnoses   Final diagnoses:  None    ED Discharge Orders    None       Clayborne Artist, PA-C 08/07/18 1636    Derwood Kaplan, MD 08/08/18 [phone]

## 2018-08-07 NOTE — ED Notes (Signed)
Discharge instructions reviewed with patient. Patient verbalizes understanding. VSS.   

## 2018-08-07 NOTE — ED Triage Notes (Signed)
Pt escorted by wife and presents with intermittent headaches x 5 months.  Each time she had had a headache, it has been accompanied with dizziness and blurry vision.  Pt attempted to see her PCP but has been unable to make it to the appointment.  Pt denies any nausea with her headache but does report light sensitivity.  Pt a/o x 4 and ambulatory in triage.

## 2018-08-07 NOTE — Discharge Instructions (Addendum)
Return to the ED immediately for new or worsening symptoms or concerns, such as return of symptoms, fevers, shortness of breath, chest pain, vomiting or any concerns at all.

## 2018-08-07 NOTE — Telephone Encounter (Signed)
Patient is experiencing headache with blurred vision. She also has elevated BP- 145/92, faint feeling - patient reports this has been going on for a while- months- patient states things are not improving. Patient has called this morning because her head is hurting and she is not better with OTC medications. Per protocol- patient advised ED for evaluation of headache and blurred vision.   Reason for Disposition . Loss of vision or double vision (Exception: same as prior migraines)  Answer Assessment - Initial Assessment Questions 1. LOCATION: "Where does it hurt?"      In middle of forehead and on the sides- like tension headache 2. ONSET: "When did the headache start?" (Minutes, hours or days)      Patient states the intensity changes- it seems to always be there 3. PATTERN: "Does the pain come and go, or has it been constant since it started?"     constant 4. SEVERITY: "How bad is the pain?" and "What does it keep you from doing?"  (e.g., Scale 1-10; mild, moderate, or severe)   - MILD (1-3): doesn't interfere with normal activities    - MODERATE (4-7): interferes with normal activities or awakens from sleep    - SEVERE (8-10): excruciating pain, unable to do any normal activities        6-7 5. RECURRENT SYMPTOM: "Have you ever had headaches before?" If so, ask: "When was the last time?" and "What happened that time?"      Patient has had headache for months 6. CAUSE: "What do you think is causing the headache?"     Patient thought it was BP- possible stress 7. MIGRAINE: "Have you been diagnosed with migraine headaches?" If so, ask: "Is this headache similar?"      no 8. HEAD INJURY: "Has there been any recent injury to the head?"      no 9. OTHER SYMPTOMS: "Do you have any other symptoms?" (fever, stiff neck, eye pain, sore throat, cold symptoms)     Eyes are sore 10. PREGNANCY: "Is there any chance you are pregnant?" "When was your last menstrual period?"       n/a  Protocols used:  HEADACHE-A-AH

## 2018-10-05 ENCOUNTER — Ambulatory Visit (INDEPENDENT_AMBULATORY_CARE_PROVIDER_SITE_OTHER): Payer: BLUE CROSS/BLUE SHIELD | Admitting: Emergency Medicine

## 2018-10-05 ENCOUNTER — Encounter: Payer: Self-pay | Admitting: Emergency Medicine

## 2018-10-05 ENCOUNTER — Other Ambulatory Visit: Payer: Self-pay

## 2018-10-05 VITALS — BP 123/74 | HR 73 | Temp 98.5°F | Ht 66.0 in | Wt 172.0 lb

## 2018-10-05 DIAGNOSIS — Z1329 Encounter for screening for other suspected endocrine disorder: Secondary | ICD-10-CM | POA: Diagnosis not present

## 2018-10-05 DIAGNOSIS — Z130 Encounter for screening for diseases of the blood and blood-forming organs and certain disorders involving the immune mechanism: Secondary | ICD-10-CM

## 2018-10-05 DIAGNOSIS — J988 Other specified respiratory disorders: Secondary | ICD-10-CM | POA: Diagnosis not present

## 2018-10-05 DIAGNOSIS — Z13220 Encounter for screening for lipoid disorders: Secondary | ICD-10-CM | POA: Diagnosis not present

## 2018-10-05 DIAGNOSIS — J22 Unspecified acute lower respiratory infection: Secondary | ICD-10-CM

## 2018-10-05 DIAGNOSIS — J439 Emphysema, unspecified: Secondary | ICD-10-CM

## 2018-10-05 DIAGNOSIS — R7303 Prediabetes: Secondary | ICD-10-CM | POA: Diagnosis not present

## 2018-10-05 DIAGNOSIS — Z1239 Encounter for other screening for malignant neoplasm of breast: Secondary | ICD-10-CM

## 2018-10-05 DIAGNOSIS — Z114 Encounter for screening for human immunodeficiency virus [HIV]: Secondary | ICD-10-CM

## 2018-10-05 DIAGNOSIS — Z0001 Encounter for general adult medical examination with abnormal findings: Secondary | ICD-10-CM

## 2018-10-05 DIAGNOSIS — Z13228 Encounter for screening for other metabolic disorders: Secondary | ICD-10-CM

## 2018-10-05 DIAGNOSIS — Z23 Encounter for immunization: Secondary | ICD-10-CM | POA: Diagnosis not present

## 2018-10-05 DIAGNOSIS — Z1231 Encounter for screening mammogram for malignant neoplasm of breast: Secondary | ICD-10-CM

## 2018-10-05 MED ORDER — BUDESONIDE-FORMOTEROL FUMARATE 80-4.5 MCG/ACT IN AERO: 2.0000 | INHALATION_SPRAY | Freq: Two times a day (BID) | RESPIRATORY_TRACT | 3 refills | Status: DC

## 2018-10-05 MED ORDER — AZITHROMYCIN 250 MG PO TABS: ORAL_TABLET | ORAL | 0 refills | Status: DC

## 2018-10-05 MED ORDER — PREDNISONE 20 MG PO TABS: 40.0000 mg | ORAL_TABLET | Freq: Every day | ORAL | 0 refills | Status: AC

## 2018-10-05 NOTE — Progress Notes (Addendum)
Evelyn Kline 47 y.o.   Chief Complaint  Patient presents with  . Annual Exam    CPE    HISTORY OF PRESENT ILLNESS: This is a 47 y.o. female here for her annual exam. Has a history of COPD secondary to alpha 1 antitrypsin deficiency. Today complaining of flulike symptoms for 1 week.  Complaining of cough, chest congestion, and intermittent wheezing.  Feels like she has a cold.  Non-smoker but partner smokes around her.   HPI   Prior to Admission medications   Medication Sig Start Date End Date Taking? Authorizing Provider  albuterol (PROVENTIL HFA;VENTOLIN HFA) 108 (90 Base) MCG/ACT inhaler Inhale 1-2 puffs into the lungs every 6 (six) hours as needed for wheezing or shortness of breath. 10/17/16  Yes Marshell Garfinkel, MD    No Known Allergies  Patient Active Problem List   Diagnosis Date Noted  . Alpha-1-antitrypsin deficiency ([place]) 03/03/2017  . COPD (chronic obstructive pulmonary disease) ([place]) 09/27/2016  . Wheezing-associated respiratory infection (WARI) 09/26/2016  . COPD (chronic obstructive pulmonary disease) with emphysema ([place]) 09/26/2016    Past Medical History:  Diagnosis Date  . Anemia   . Bowel obstruction ([place]) 2012  . COPD (chronic obstructive pulmonary disease) ([place])   . Depression   . History of blood transfusion     Past Surgical History:  Procedure Laterality Date  . ABDOMINAL HYSTERECTOMY    . LACERATION REPAIR Right    arm with artery involvement  . ORIF ANKLE FRACTURE Left 09/04/2015   Procedure: OPEN REDUCTION INTERNAL FIXATION (ORIF) LEFT BIOMALLEOLAR FRACTURE;  Surgeon: Wylene Simmer, MD;  Location: [facility];  Service: Orthopedics;  Laterality: Left;  . SMALL INTESTINE SURGERY  2012  . TUBAL LIGATION    . VAGINAL DELIVERY     x3, epidural anesth. with 2 of them  . WISDOM TOOTH EXTRACTION      Social History   Socioeconomic History  . Marital status: Married    Spouse name: Not on file  . Number of children: Not on file  . Years of  education: Not on file  . Highest education level: Not on file  Occupational History  . Occupation: Presenter, broadcasting  Social Needs  . Financial resource strain: Not on file  . Food insecurity:    Worry: Not on file    Inability: Not on file  . Transportation needs:    Medical: Not on file    Non-medical: Not on file  Tobacco Use  . Smoking status: Never Smoker  . Smokeless tobacco: Never Used  Substance and Sexual Activity  . Alcohol use: No  . Drug use: No  . Sexual activity: Not on file  Lifestyle  . Physical activity:    Days per week: Not on file    Minutes per session: Not on file  . Stress: Not on file  Relationships  . Social connections:    Talks on phone: Not on file    Gets together: Not on file    Attends religious service: Not on file    Active member of club or organization: Not on file    Attends meetings of clubs or organizations: Not on file    Relationship status: Not on file  . Intimate partner violence:    Fear of current or ex partner: Not on file    Emotionally abused: Not on file    Physically abused: Not on file    Forced sexual activity: Not on file  Other Topics Concern  .  Not on file  Social History Narrative   Married, lives with spouse and grandkids   3 children   OCCUPATION: security and CNA    Family History  Problem Relation Age of Onset  . Diabetes Father   . Diabetes Other   . Hypertension Other   . COPD Paternal Grandmother   . COPD Maternal Aunt      Review of Systems  Constitutional: Negative.  Negative for chills and fever.  HENT: Positive for congestion. Negative for sore throat.   Eyes: Negative.  Negative for discharge and redness.  Respiratory: Positive for cough, sputum production, shortness of breath and wheezing. Negative for hemoptysis.   Cardiovascular: Negative.  Negative for chest pain and palpitations.  Gastrointestinal: Negative.  Negative for abdominal pain, blood in stool, diarrhea, melena, nausea and  vomiting.  Genitourinary: Negative.  Negative for dysuria and hematuria.  Musculoskeletal: Negative.  Negative for back pain, joint pain and neck pain.  Skin: Negative.  Negative for rash.  Neurological: Negative.  Negative for dizziness and headaches.  Endo/Heme/Allergies: Negative.   All other systems reviewed and are negative.  Vitals:   10/05/18 1509  BP: 123/74  Pulse: 73  Temp: 98.5 F (36.9 C)  SpO2: 97%     Physical Exam  Constitutional: She is oriented to person, place, and time. She appears well-developed and well-nourished.  HENT:  Head: Normocephalic and atraumatic.  Right Ear: External ear normal.  Left Ear: External ear normal.  Nose: Nose normal.  Mouth/Throat: Oropharynx is clear and moist.  Eyes: Pupils are equal, round, and reactive to light. Conjunctivae and EOM are normal.  Neck: Normal range of motion. Neck supple. No JVD present. No thyromegaly present.  Cardiovascular: Normal rate, regular rhythm and normal heart sounds.  Pulmonary/Chest: Effort normal and breath sounds normal.  Abdominal: Soft. Bowel sounds are normal. She exhibits no distension. There is no tenderness.  Musculoskeletal: Normal range of motion. She exhibits no edema or tenderness.  Lymphadenopathy:    She has no cervical adenopathy.  Neurological: She is alert and oriented to person, place, and time. No sensory deficit. She exhibits normal muscle tone. Coordination normal.  Skin: Skin is warm and dry. Capillary refill takes less than 2 seconds.  Psychiatric: She has a normal mood and affect. Her behavior is normal.  Vitals reviewed.    ASSESSMENT & PLAN: Evelyn Kline was seen today for annual exam.  Diagnoses and all orders for this visit:  Encounter for general adult medical examination with abnormal findings  Breast cancer screening -     MM Digital Screening; Future  Screening for HIV (human immunodeficiency virus) -     HIV antibody  Visit for screening  mammogram  Screening for deficiency anemia -     CBC with Differential  Screening for lipoid disorders -     Lipid panel  Screening for endocrine, metabolic and immunity disorder -     Comprehensive metabolic panel -     Hemoglobin A1c -     Lipid panel  Vaccine for diphtheria-tetanus-pertussis, combined -     Tdap vaccine greater than or equal to 7yo IM  Wheezing-associated respiratory infection (WARI) -     predniSONE (DELTASONE) 20 MG tablet; Take 2 tablets (40 mg total) by mouth daily with breakfast for 5 days. -     budesonide-formoterol (SYMBICORT) 80-4.5 MCG/ACT inhaler; Inhale 2 puffs into the lungs 2 (two) times daily.  Pulmonary emphysema, unspecified emphysema type ([place]) -     predniSONE (DELTASONE)  20 MG tablet; Take 2 tablets (40 mg total) by mouth daily with breakfast for 5 days. -     budesonide-formoterol (SYMBICORT) 80-4.5 MCG/ACT inhaler; Inhale 2 puffs into the lungs 2 (two) times daily.  Lower respiratory infection -     azithromycin (ZITHROMAX) 250 MG tablet; Sig as indicated  Prediabetes  Other orders -     Cancel: Flu Vaccine QUAD 36+ mos IM    Patient Instructions       If you have lab work done today you will be contacted with your lab results within the next 2 weeks.  If you have not heard from [place] then please contact us. The fastest way to get your results is to register for My Chart.   IF you received an x-ray today, you will receive an invoice from [facility]. Please contact [facility] at [phone] with questions or concerns regarding your invoice.   IF you received labwork today, you will receive an invoice from [facility]. Please contact [facility] at [phone] with questions or concerns regarding your invoice.   Our billing staff will not be able to assist you with questions regarding bills from these companies.  You will be contacted with the lab results as soon as they are available. The fastest way to get your  results is to activate your My Chart account. Instructions are located on the last page of this paperwork. If you have not heard from [place] regarding the results in 2 weeks, please contact this office.      Health Maintenance, Female Adopting a healthy lifestyle and getting preventive care can go a long way to promote health and wellness. Talk with your health care provider about what schedule of regular examinations is right for you. This is a good chance for you to check in with your provider about disease prevention and staying healthy. In between checkups, there are plenty of things you can do on your own. Experts have done a lot of research about which lifestyle changes and preventive measures are most likely to keep you healthy. Ask your health care provider for more information. Weight and diet Eat a healthy diet  Be sure to include plenty of vegetables, fruits, low-fat dairy products, and lean protein.  Do not eat a lot of foods high in solid fats, added sugars, or salt.  Get regular exercise. This is one of the most important things you can do for your health. ? Most adults should exercise for at least 150 minutes each week. The exercise should increase your heart rate and make you sweat (moderate-intensity exercise). ? Most adults should also do strengthening exercises at least twice a week. This is in addition to the moderate-intensity exercise.  Maintain a healthy weight  Body mass index (BMI) is a measurement that can be used to identify possible weight problems. It estimates body fat based on height and weight. Your health care provider can help determine your BMI and help you achieve or maintain a healthy weight.  For females 70 years of age and older: ? A BMI below 18.5 is considered underweight. ? A BMI of 18.5 to 24.9 is normal. ? A BMI of 25 to 29.9 is considered overweight. ? A BMI of 30 and above is considered obese.  Watch levels of cholesterol and blood lipids  You  should start having your blood tested for lipids and cholesterol at 45 years of age, then have this test every 5 years.  You may need to have your cholesterol  levels checked more often if: ? Your lipid or cholesterol levels are high. ? You are older than 45 years of age. ? You are at high risk for heart disease.  Cancer screening Lung Cancer  Lung cancer screening is recommended for adults 68-98 years old who are at high risk for lung cancer because of a history of smoking.  A yearly low-dose CT scan of the lungs is recommended for people who: ? Currently smoke. ? Have quit within the past 15 years. ? Have at least a 30-pack-year history of smoking. A pack year is smoking an average of one pack of cigarettes a day for 1 year.  Yearly screening should continue until it has been 15 years since you quit.  Yearly screening should stop if you develop a health problem that would prevent you from having lung cancer treatment.  Breast Cancer  Practice breast self-awareness. This means understanding how your breasts normally appear and feel.  It also means doing regular breast self-exams. Let your health care provider know about any changes, no matter how small.  If you are in your 20s or 30s, you should have a clinical breast exam (CBE) by a health care provider every 1-3 years as part of a regular health exam.  If you are 41 or older, have a CBE every year. Also consider having a breast X-ray (mammogram) every year.  If you have a family history of breast cancer, talk to your health care provider about genetic screening.  If you are at high risk for breast cancer, talk to your health care provider about having an MRI and a mammogram every year.  Breast cancer gene (BRCA) assessment is recommended for women who have family members with BRCA-related cancers. BRCA-related cancers include: ? Breast. ? Ovarian. ? Tubal. ? Peritoneal cancers.  Results of the assessment will determine the  need for genetic counseling and BRCA1 and BRCA2 testing.  Cervical Cancer Your health care provider may recommend that you be screened regularly for cancer of the pelvic organs (ovaries, uterus, and vagina). This screening involves a pelvic examination, including checking for microscopic changes to the surface of your cervix (Pap test). You may be encouraged to have this screening done every 3 years, beginning at age 25.  For women ages 87-65, health care providers may recommend pelvic exams and Pap testing every 3 years, or they may recommend the Pap and pelvic exam, combined with testing for human papilloma virus (HPV), every 5 years. Some types of HPV increase your risk of cervical cancer. Testing for HPV may also be done on women of any age with unclear Pap test results.  Other health care providers may not recommend any screening for nonpregnant women who are considered low risk for pelvic cancer and who do not have symptoms. Ask your health care provider if a screening pelvic exam is right for you.  If you have had past treatment for cervical cancer or a condition that could lead to cancer, you need Pap tests and screening for cancer for at least 20 years after your treatment. If Pap tests have been discontinued, your risk factors (such as having a new sexual partner) need to be reassessed to determine if screening should resume. Some women have medical problems that increase the chance of getting cervical cancer. In these cases, your health care provider may recommend more frequent screening and Pap tests.  Colorectal Cancer  This type of cancer can be detected and often prevented.  Routine colorectal cancer screening  usually begins at 45 years of age and continues through 45 years of age.  Your health care provider may recommend screening at an earlier age if you have risk factors for colon cancer.  Your health care provider may also recommend using home test kits to check for hidden blood  in the stool.  A small camera at the end of a tube can be used to examine your colon directly (sigmoidoscopy or colonoscopy). This is done to check for the earliest forms of colorectal cancer.  Routine screening usually begins at age 60.  Direct examination of the colon should be repeated every 5-10 years through 45 years of age. However, you may need to be screened more often if early forms of precancerous polyps or small growths are found.  Skin Cancer  Check your skin from head to toe regularly.  Tell your health care provider about any new moles or changes in moles, especially if there is a change in a mole's shape or color.  Also tell your health care provider if you have a mole that is larger than the size of a pencil eraser.  Always use sunscreen. Apply sunscreen liberally and repeatedly throughout the day.  Protect yourself by wearing long sleeves, pants, a wide-brimmed hat, and sunglasses whenever you are outside.  Heart disease, diabetes, and high blood pressure  High blood pressure causes heart disease and increases the risk of stroke. High blood pressure is more likely to develop in: ? People who have blood pressure in the high end of the normal range (130-139/85-89 mm Hg). ? People who are overweight or obese. ? People who are African American.  If you are 65-53 years of age, have your blood pressure checked every 3-5 years. If you are 49 years of age or older, have your blood pressure checked every year. You should have your blood pressure measured twice-once when you are at a hospital or clinic, and once when you are not at a hospital or clinic. Record the average of the two measurements. To check your blood pressure when you are not at a hospital or clinic, you can use: ? An automated blood pressure machine at a pharmacy. ? A home blood pressure monitor.  If you are between 69 years and 64 years old, ask your health care provider if you should take aspirin to prevent  strokes.  Have regular diabetes screenings. This involves taking a blood sample to check your fasting blood sugar level. ? If you are at a normal weight and have a low risk for diabetes, have this test once every three years after 45 years of age. ? If you are overweight and have a high risk for diabetes, consider being tested at a younger age or more often. Preventing infection Hepatitis B  If you have a higher risk for hepatitis B, you should be screened for this virus. You are considered at high risk for hepatitis B if: ? You were born in a country where hepatitis B is common. Ask your health care provider which countries are considered high risk. ? Your parents were born in a high-risk country, and you have not been immunized against hepatitis B (hepatitis B vaccine). ? You have HIV or AIDS. ? You use needles to inject street drugs. ? You live with someone who has hepatitis B. ? You have had sex with someone who has hepatitis B. ? You get hemodialysis treatment. ? You take certain medicines for conditions, including cancer, organ transplantation, and autoimmune conditions.  Hepatitis C  Blood testing is recommended for: ? Everyone born from [birth year] through 1963. ? Anyone with known risk factors for hepatitis C.  Sexually transmitted infections (STIs)  You should be screened for sexually transmitted infections (STIs) including gonorrhea and chlamydia if: ? You are sexually active and are younger than 45 years of age. ? You are older than 45 years of age and your health care provider tells you that you are at risk for this type of infection. ? Your sexual activity has changed since you were last screened and you are at an increased risk for chlamydia or gonorrhea. Ask your health care provider if you are at risk.  If you do not have HIV, but are at risk, it may be recommended that you take a prescription medicine daily to prevent HIV infection. This is called pre-exposure prophylaxis  (PrEP). You are considered at risk if: ? You are sexually active and do not regularly use condoms or know the HIV status of your partner(s). ? You take drugs by injection. ? You are sexually active with a partner who has HIV.  Talk with your health care provider about whether you are at high risk of being infected with HIV. If you choose to begin PrEP, you should first be tested for HIV. You should then be tested every 3 months for as long as you are taking PrEP. Pregnancy  If you are premenopausal and you may become pregnant, ask your health care provider about preconception counseling.  If you may become pregnant, take 400 to 800 micrograms (mcg) of folic acid every day.  If you want to prevent pregnancy, talk to your health care provider about birth control (contraception). Osteoporosis and menopause  Osteoporosis is a disease in which the bones lose minerals and strength with aging. This can result in serious bone fractures. Your risk for osteoporosis can be identified using a bone density scan.  If you are 35 years of age or older, or if you are at risk for osteoporosis and fractures, ask your health care provider if you should be screened.  Ask your health care provider whether you should take a calcium or vitamin D supplement to lower your risk for osteoporosis.  Menopause may have certain physical symptoms and risks.  Hormone replacement therapy may reduce some of these symptoms and risks. Talk to your health care provider about whether hormone replacement therapy is right for you. Follow these instructions at home:  Schedule regular health, dental, and eye exams.  Stay current with your immunizations.  Do not use any tobacco products including cigarettes, chewing tobacco, or electronic cigarettes.  If you are pregnant, do not drink alcohol.  If you are breastfeeding, limit how much and how often you drink alcohol.  Limit alcohol intake to no more than 1 drink per day for  nonpregnant women. One drink equals 12 ounces of beer, 5 ounces of wine, or 1 ounces of hard liquor.  Do not use street drugs.  Do not share needles.  Ask your health care provider for help if you need support or information about quitting drugs.  Tell your health care provider if you often feel depressed.  Tell your health care provider if you have ever been abused or do not feel safe at home. This information is not intended to replace advice given to you by your health care provider. Make sure you discuss any questions you have with your health care provider. Document Released: 05/06/2011 Document Revised: 03/28/2016 Document Reviewed:  07/25/2015 Elsevier Interactive Patient Education  2018 [employer]      Agustina Caroli, MD [facility]

## 2018-10-05 NOTE — Patient Instructions (Addendum)
   If you have lab work done today you will be contacted with your lab results within the next 2 weeks.  If you have not heard from us then please contact us. The fastest way to get your results is to register for My Chart.   IF you received an x-ray today, you will receive an invoice from [facility]. Please contact [facility] at [phone] with questions or concerns regarding your invoice.   IF you received labwork today, you will receive an invoice from [facility]. Please contact [facility] at [phone] with questions or concerns regarding your invoice.   Our billing staff will not be able to assist you with questions regarding bills from these companies.  You will be contacted with the lab results as soon as they are available. The fastest way to get your results is to activate your My Chart account. Instructions are located on the last page of this paperwork. If you have not heard from us regarding the results in 2 weeks, please contact this office.     Health Maintenance, Female Adopting a healthy lifestyle and getting preventive care can go a long way to promote health and wellness. Talk with your health care provider about what schedule of regular examinations is right for you. This is a good chance for you to check in with your provider about disease prevention and staying healthy. In between checkups, there are plenty of things you can do on your own. Experts have done a lot of research about which lifestyle changes and preventive measures are most likely to keep you healthy. Ask your health care provider for more information. Weight and diet Eat a healthy diet  Be sure to include plenty of vegetables, fruits, low-fat dairy products, and lean protein.  Do not eat a lot of foods high in solid fats, added sugars, or salt.  Get regular exercise. This is one of the most important things you can do for your health. ? Most adults should exercise for at least 150  minutes each week. The exercise should increase your heart rate and make you sweat (moderate-intensity exercise). ? Most adults should also do strengthening exercises at least twice a week. This is in addition to the moderate-intensity exercise.  Maintain a healthy weight  Body mass index (BMI) is a measurement that can be used to identify possible weight problems. It estimates body fat based on height and weight. Your health care provider can help determine your BMI and help you achieve or maintain a healthy weight.  For females 20 years of age and older: ? A BMI below 18.5 is considered underweight. ? A BMI of 18.5 to 24.9 is normal. ? A BMI of 25 to 29.9 is considered overweight. ? A BMI of 30 and above is considered obese.  Watch levels of cholesterol and blood lipids  You should start having your blood tested for lipids and cholesterol at 45 years of age, then have this test every 5 years.  You may need to have your cholesterol levels checked more often if: ? Your lipid or cholesterol levels are high. ? You are older than 45 years of age. ? You are at high risk for heart disease.  Cancer screening Lung Cancer  Lung cancer screening is recommended for adults 55-80 years old who are at high risk for lung cancer because of a history of smoking.  A yearly low-dose CT scan of the lungs is recommended for people who: ? Currently smoke. ? Have quit   within the past 15 years. ? Have at least a 30-pack-year history of smoking. A pack year is smoking an average of one pack of cigarettes a day for 1 year.  Yearly screening should continue until it has been 15 years since you quit.  Yearly screening should stop if you develop a health problem that would prevent you from having lung cancer treatment.  Breast Cancer  Practice breast self-awareness. This means understanding how your breasts normally appear and feel.  It also means doing regular breast self-exams. Let your health care  provider know about any changes, no matter how small.  If you are in your 20s or 30s, you should have a clinical breast exam (CBE) by a health care provider every 1-3 years as part of a regular health exam.  If you are 40 or older, have a CBE every year. Also consider having a breast X-ray (mammogram) every year.  If you have a family history of breast cancer, talk to your health care provider about genetic screening.  If you are at high risk for breast cancer, talk to your health care provider about having an MRI and a mammogram every year.  Breast cancer gene (BRCA) assessment is recommended for women who have family members with BRCA-related cancers. BRCA-related cancers include: ? Breast. ? Ovarian. ? Tubal. ? Peritoneal cancers.  Results of the assessment will determine the need for genetic counseling and BRCA1 and BRCA2 testing.  Cervical Cancer Your health care provider may recommend that you be screened regularly for cancer of the pelvic organs (ovaries, uterus, and vagina). This screening involves a pelvic examination, including checking for microscopic changes to the surface of your cervix (Pap test). You may be encouraged to have this screening done every 3 years, beginning at age 21.  For women ages 30-65, health care providers may recommend pelvic exams and Pap testing every 3 years, or they may recommend the Pap and pelvic exam, combined with testing for human papilloma virus (HPV), every 5 years. Some types of HPV increase your risk of cervical cancer. Testing for HPV may also be done on women of any age with unclear Pap test results.  Other health care providers may not recommend any screening for nonpregnant women who are considered low risk for pelvic cancer and who do not have symptoms. Ask your health care provider if a screening pelvic exam is right for you.  If you have had past treatment for cervical cancer or a condition that could lead to cancer, you need Pap tests  and screening for cancer for at least 20 years after your treatment. If Pap tests have been discontinued, your risk factors (such as having a new sexual partner) need to be reassessed to determine if screening should resume. Some women have medical problems that increase the chance of getting cervical cancer. In these cases, your health care provider may recommend more frequent screening and Pap tests.  Colorectal Cancer  This type of cancer can be detected and often prevented.  Routine colorectal cancer screening usually begins at 45 years of age and continues through 45 years of age.  Your health care provider may recommend screening at an earlier age if you have risk factors for colon cancer.  Your health care provider may also recommend using home test kits to check for hidden blood in the stool.  A small camera at the end of a tube can be used to examine your colon directly (sigmoidoscopy or colonoscopy). This is done to check   for the earliest forms of colorectal cancer.  Routine screening usually begins at age 50.  Direct examination of the colon should be repeated every 5-10 years through 45 years of age. However, you may need to be screened more often if early forms of precancerous polyps or small growths are found.  Skin Cancer  Check your skin from head to toe regularly.  Tell your health care provider about any new moles or changes in moles, especially if there is a change in a mole's shape or color.  Also tell your health care provider if you have a mole that is larger than the size of a pencil eraser.  Always use sunscreen. Apply sunscreen liberally and repeatedly throughout the day.  Protect yourself by wearing long sleeves, pants, a wide-brimmed hat, and sunglasses whenever you are outside.  Heart disease, diabetes, and high blood pressure  High blood pressure causes heart disease and increases the risk of stroke. High blood pressure is more likely to develop  in: ? People who have blood pressure in the high end of the normal range (130-139/85-89 mm Hg). ? People who are overweight or obese. ? People who are African American.  If you are 18-39 years of age, have your blood pressure checked every 3-5 years. If you are 40 years of age or older, have your blood pressure checked every year. You should have your blood pressure measured twice-once when you are at a hospital or clinic, and once when you are not at a hospital or clinic. Record the average of the two measurements. To check your blood pressure when you are not at a hospital or clinic, you can use: ? An automated blood pressure machine at a pharmacy. ? A home blood pressure monitor.  If you are between 55 years and 79 years old, ask your health care provider if you should take aspirin to prevent strokes.  Have regular diabetes screenings. This involves taking a blood sample to check your fasting blood sugar level. ? If you are at a normal weight and have a low risk for diabetes, have this test once every three years after 45 years of age. ? If you are overweight and have a high risk for diabetes, consider being tested at a younger age or more often. Preventing infection Hepatitis B  If you have a higher risk for hepatitis B, you should be screened for this virus. You are considered at high risk for hepatitis B if: ? You were born in a country where hepatitis B is common. Ask your health care provider which countries are considered high risk. ? Your parents were born in a high-risk country, and you have not been immunized against hepatitis B (hepatitis B vaccine). ? You have HIV or AIDS. ? You use needles to inject street drugs. ? You live with someone who has hepatitis B. ? You have had sex with someone who has hepatitis B. ? You get hemodialysis treatment. ? You take certain medicines for conditions, including cancer, organ transplantation, and autoimmune conditions.  Hepatitis C  Blood  testing is recommended for: ? Everyone born from 1945 through 1965. ? Anyone with known risk factors for hepatitis C.  Sexually transmitted infections (STIs)  You should be screened for sexually transmitted infections (STIs) including gonorrhea and chlamydia if: ? You are sexually active and are younger than 45 years of age. ? You are older than 45 years of age and your health care provider tells you that you are at risk for   this type of infection. ? Your sexual activity has changed since you were last screened and you are at an increased risk for chlamydia or gonorrhea. Ask your health care provider if you are at risk.  If you do not have HIV, but are at risk, it may be recommended that you take a prescription medicine daily to prevent HIV infection. This is called pre-exposure prophylaxis (PrEP). You are considered at risk if: ? You are sexually active and do not regularly use condoms or know the HIV status of your partner(s). ? You take drugs by injection. ? You are sexually active with a partner who has HIV.  Talk with your health care provider about whether you are at high risk of being infected with HIV. If you choose to begin PrEP, you should first be tested for HIV. You should then be tested every 3 months for as long as you are taking PrEP. Pregnancy  If you are premenopausal and you may become pregnant, ask your health care provider about preconception counseling.  If you may become pregnant, take 400 to 800 micrograms (mcg) of folic acid every day.  If you want to prevent pregnancy, talk to your health care provider about birth control (contraception). Osteoporosis and menopause  Osteoporosis is a disease in which the bones lose minerals and strength with aging. This can result in serious bone fractures. Your risk for osteoporosis can be identified using a bone density scan.  If you are 65 years of age or older, or if you are at risk for osteoporosis and fractures, ask your  health care provider if you should be screened.  Ask your health care provider whether you should take a calcium or vitamin D supplement to lower your risk for osteoporosis.  Menopause may have certain physical symptoms and risks.  Hormone replacement therapy may reduce some of these symptoms and risks. Talk to your health care provider about whether hormone replacement therapy is right for you. Follow these instructions at home:  Schedule regular health, dental, and eye exams.  Stay current with your immunizations.  Do not use any tobacco products including cigarettes, chewing tobacco, or electronic cigarettes.  If you are pregnant, do not drink alcohol.  If you are breastfeeding, limit how much and how often you drink alcohol.  Limit alcohol intake to no more than 1 drink per day for nonpregnant women. One drink equals 12 ounces of beer, 5 ounces of wine, or 1 ounces of hard liquor.  Do not use street drugs.  Do not share needles.  Ask your health care provider for help if you need support or information about quitting drugs.  Tell your health care provider if you often feel depressed.  Tell your health care provider if you have ever been abused or do not feel safe at home. This information is not intended to replace advice given to you by your health care provider. Make sure you discuss any questions you have with your health care provider. Document Released: 05/06/2011 Document Revised: 03/28/2016 Document Reviewed: 07/25/2015 Elsevier Interactive Patient Education  2018 [employer]vier Inc.  

## 2018-10-06 ENCOUNTER — Encounter: Payer: Self-pay | Admitting: *Deleted

## 2018-10-06 LAB — COMPREHENSIVE METABOLIC PANEL
ALT: 22 IU/L (ref 0–32)
AST: 22 IU/L (ref 0–40)
Albumin/Globulin Ratio: 1.4 (ref 1.2–2.2)
Albumin: 4.1 g/dL (ref 3.5–5.5)
Alkaline Phosphatase: 149 IU/L — ABNORMAL HIGH (ref 39–117)
BUN/Creatinine Ratio: 10 (ref 9–23)
BUN: 6 mg/dL (ref 6–24)
Bilirubin Total: 0.4 mg/dL (ref 0.0–1.2)
CALCIUM: 9.1 mg/dL (ref 8.7–10.2)
CHLORIDE: 101 mmol/L (ref 96–106)
CO2: 22 mmol/L (ref 20–29)
Creatinine, Ser: 0.63 mg/dL (ref 0.57–1.00)
GFR calc Af Amer: 125 mL/min/{1.73_m2} (ref >59)
GFR, EST NON AFRICAN AMERICAN: 109 mL/min/{1.73_m2} (ref >59)
Globulin, Total: 3 g/dL (ref 1.5–4.5)
Glucose: 111 mg/dL — ABNORMAL HIGH (ref 65–99)
Potassium: 3.9 mmol/L (ref 3.5–5.2)
Sodium: 137 mmol/L (ref 134–144)
TOTAL PROTEIN: 7.1 g/dL (ref 6.0–8.5)

## 2018-10-06 LAB — CBC WITH DIFFERENTIAL/PLATELET
Basophils Absolute: 0 10*3/uL (ref 0.0–0.2)
Basos: 1 %
EOS (ABSOLUTE): 0.2 10*3/uL (ref 0.0–0.4)
EOS: 3 %
Hematocrit: 45.7 % (ref 34.0–46.6)
Hemoglobin: 15.2 g/dL (ref 11.1–15.9)
IMMATURE GRANS (ABS): 0 10*3/uL (ref 0.0–0.1)
IMMATURE GRANULOCYTES: 0 %
LYMPHS: 28 %
Lymphocytes Absolute: 2 10*3/uL (ref 0.7–3.1)
MCH: 28.1 pg (ref 26.6–33.0)
MCHC: 33.3 g/dL (ref 31.5–35.7)
MCV: 85 fL (ref 79–97)
MONOS ABS: 0.4 10*3/uL (ref 0.1–0.9)
Monocytes: 5 %
NEUTROS PCT: 63 %
Neutrophils Absolute: 4.5 10*3/uL (ref 1.4–7.0)
PLATELETS: 262 10*3/uL (ref 150–450)
RBC: 5.4 x10E6/uL — AB (ref 3.77–5.28)
RDW: 13 % (ref 12.3–15.4)
WBC: 7.1 10*3/uL (ref 3.4–10.8)

## 2018-10-06 LAB — LIPID PANEL
CHOL/HDL RATIO: 3 ratio (ref 0.0–4.4)
CHOLESTEROL TOTAL: 184 mg/dL (ref 100–199)
HDL: 61 mg/dL (ref >39)
LDL Calculated: 95 mg/dL (ref 0–99)
TRIGLYCERIDES: 140 mg/dL (ref 0–149)
VLDL Cholesterol Cal: 28 mg/dL (ref 5–40)

## 2018-10-06 LAB — HIV ANTIBODY (ROUTINE TESTING W REFLEX): HIV Screen 4th Generation wRfx: NONREACTIVE

## 2018-10-06 LAB — HEMOGLOBIN A1C
ESTIMATED AVERAGE GLUCOSE: 120 mg/dL
Hgb A1c MFr Bld: 5.8 % — ABNORMAL HIGH (ref 4.8–5.6)

## 2018-10-21 ENCOUNTER — Ambulatory Visit: Payer: BLUE CROSS/BLUE SHIELD | Admitting: Emergency Medicine

## 2018-11-09 ENCOUNTER — Telehealth: Payer: Self-pay | Admitting: Family Medicine

## 2018-11-09 NOTE — Telephone Encounter (Signed)
Due to a template problem, pt will need to be rescheduled. Dr. Leretha Pol is not in the office this day. LVM for pt to call the office and reschedule appt. When pt calls back, please reschedule appt with Dr. Leretha Pol at their convenience. Thank you!Due to a template problem, pt will need to be rescheduled. Dr. Leretha Pol is not in the office this day. LVM for pt to call the office and reschedule appt. When pt calls back, please reschedule appt with Dr. Leretha Pol at their convenience. Thank you!

## 2018-11-10 ENCOUNTER — Telehealth: Payer: Self-pay | Admitting: Emergency Medicine

## 2018-11-10 ENCOUNTER — Other Ambulatory Visit: Payer: Self-pay | Admitting: Emergency Medicine

## 2018-11-10 NOTE — Telephone Encounter (Signed)
Copied from CRM #[id number]. Topic: Quick Communication - Rx Refill/Question >> Nov 10, 2018  9:45 AM Payne, Angela L wrote: Medication: sertraline (ZOLOFT) 50 MG tablet  traZODone (DESYREL) 50 MG tablet  Pt has an appt on 12-01-18 with Sagardia  pt never has gotten medicines from Dr Sagardia before behavioral health has  Has the patient contacted their pharmacy? Yes.   (Agent: If no, request that the patient contact the pharmacy for the refill.) (Agent: If yes, when and what did the pharmacy advise?) today the pt never has gotten medicines from Dr Sagardia before behavioral health has  Preferred Pharmacy (with phone number or street name): [id number] - [place], [place] - [address] [phone] (Phone) [phone] (Fax)    Agent: Please be advised that RX refills may take up to 3 business days. We ask that you follow-up with your pharmacy. 

## 2018-11-10 NOTE — Telephone Encounter (Signed)
Duplicate encounter

## 2018-11-10 NOTE — Telephone Encounter (Signed)
Pt requesting refills of medications listed below that were previously filled by behavioral health. Medications not listed on current med list.

## 2018-11-10 NOTE — Telephone Encounter (Signed)
Error need to go to Nurse Triage

## 2018-11-10 NOTE — Telephone Encounter (Signed)
Requested medication (s) are due for refill today: ?  Requested medication (s) are on the active medication list: no  Last refill:  ?  Future visit scheduled: yes 12/01/2018  Notes to clinic: previously from [facility]    Requested Prescriptions  Pending Prescriptions Disp Refills   FLUoxetine (PROZAC) 20 MG tablet [Pharmacy Med Name: FLUOXETINE HCL 20 MG TABLET] 30 tablet 2    Sig: TAKE 1 TABLET BY MOUTH EVERY DAY IN THE MORNING AS DIRECTED     Psychiatry:  Antidepressants - SSRI Passed - 11/10/2018  9:53 AM      Passed - Valid encounter within last 6 months    Recent Outpatient Visits          1 month ago Encounter for general adult medical examination with abnormal findings   Primary Care at [facility], [place], [place]   10 months ago Acute bronchitis, unspecified organism   Primary Care at [place], [place], [place]   10 months ago Acute upper respiratory infection   Primary Care at [place], [place], [place]      Future Appointments            In 3 weeks Sagardia, Eilleen Kempf, MD [facility] at [place], [facility]          traZODone (DESYREL) 50 MG tablet [Pharmacy Med Name: TRAZODONE 50 MG TABLET] 30 tablet 2    Sig: TAKE 1 TABLET BY MOUTH EVERY DAY AT BEDTIME AS NEEDED FOR SLEEP     Psychiatry: Antidepressants - Serotonin Modulator Passed - 11/10/2018  9:53 AM      Passed - Valid encounter within last 6 months    Recent Outpatient Visits          1 month ago Encounter for general adult medical examination with abnormal findings   Primary Care at [facility], [place], [place]   10 months ago Acute bronchitis, unspecified organism   Primary Care at [place], [place], [place]   10 months ago Acute upper respiratory infection   Primary Care at [place], [place], [place]      Future Appointments            In 3 weeks Sagardia, Eilleen Kempf, MD Primary Care at [place], [facility]

## 2018-11-10 NOTE — Telephone Encounter (Signed)
Copied from CRM [id number]. Topic: Quick Communication - Rx Refill/Question >> Nov 10, 2018  9:45 AM Gerrianne ScalePayne, Jolynne Spurgin L wrote: Medication: sertraline (ZOLOFT) 50 MG tablet  traZODone (DESYREL) 50 MG tablet  Pt has an appt on 12-01-18 with Sagardia  pt never has gotten medicines from Dr Alvy BimlerSagardia before behavioral health has  Has the patient contacted their pharmacy? Yes.   (Agent: If no, request that the patient contact the pharmacy for the refill.) (Agent: If yes, when and what did the pharmacy advise?) today the pt never has gotten medicines from Dr Alvy BimlerSagardia before behavioral health has  Preferred Pharmacy (with phone number or street name): [id number]- [place], [place] - [address] [phone] (Phone) [phone] (Fax)    Agent: Please be advised that RX refills may take up to 3 business days. We ask that you follow-up with your pharmacy.

## 2018-11-11 ENCOUNTER — Ambulatory Visit: Payer: BLUE CROSS/BLUE SHIELD | Admitting: Family Medicine

## 2018-11-12 NOTE — Telephone Encounter (Signed)
Please advise 

## 2018-11-12 NOTE — Telephone Encounter (Signed)
Do not feel comfortable prescribing medications that I have never prescribed before for this patient.  She should contact behavioral health and then I will see her during her appointment on 12/01/2018.

## 2018-11-17 NOTE — Telephone Encounter (Signed)
Spoke with pt and informed her of providers recommendations, she verbalized understanding.

## 2018-12-01 ENCOUNTER — Ambulatory Visit: Payer: BLUE CROSS/BLUE SHIELD | Admitting: Emergency Medicine

## 2018-12-03 ENCOUNTER — Ambulatory Visit: Payer: Self-pay

## 2018-12-28 ENCOUNTER — Encounter

## 2018-12-28 ENCOUNTER — Other Ambulatory Visit: Payer: Self-pay

## 2018-12-28 ENCOUNTER — Ambulatory Visit: Payer: BLUE CROSS/BLUE SHIELD | Admitting: Emergency Medicine

## 2018-12-28 ENCOUNTER — Encounter: Payer: Self-pay | Admitting: Emergency Medicine

## 2018-12-28 VITALS — BP 115/80 | HR 79 | Temp 97.9°F | Resp 16 | Ht 66.0 in | Wt 169.4 lb

## 2018-12-28 DIAGNOSIS — G4700 Insomnia, unspecified: Secondary | ICD-10-CM

## 2018-12-28 DIAGNOSIS — Z124 Encounter for screening for malignant neoplasm of cervix: Secondary | ICD-10-CM

## 2018-12-28 DIAGNOSIS — F331 Major depressive disorder, recurrent, moderate: Secondary | ICD-10-CM

## 2018-12-28 MED ORDER — HYDROXYZINE HCL 25 MG PO TABS: 25.0000 mg | ORAL_TABLET | Freq: Every evening | ORAL | 1 refills | Status: AC | PRN

## 2018-12-28 MED ORDER — DULOXETINE HCL 30 MG PO CPEP: 30.0000 mg | ORAL_CAPSULE | Freq: Every day | ORAL | 3 refills | Status: AC

## 2018-12-28 NOTE — Progress Notes (Signed)
Evelyn Kline 48 y.o.   Chief Complaint  Patient presents with  . Medication Refill    patient will discuss, - she wants to change to something stronger, she was taking Mirtazapine    HISTORY OF PRESENT ILLNESS: This is a 48 y.o. female with a history of depression for at least 10 years, off medications the past 2 months, would like to start taking something different than before.  Has been seeing behavioral health who has been prescribing medication. Depression screen [facility] 2/9 12/28/2018 10/05/2018 12/31/2017  Decreased Interest 0 0 0  Down, Depressed, Hopeless 1 0 0  PHQ - 2 Score 1 0 0   No other complaints or medical concerns today.   HPI   Prior to Admission medications   Medication Sig Start Date End Date Taking? Authorizing Provider  albuterol (PROVENTIL HFA;VENTOLIN HFA) 108 (90 Base) MCG/ACT inhaler Inhale 1-2 puffs into the lungs every 6 (six) hours as needed for wheezing or shortness of breath. 10/17/16  Yes Mannam, Praveen, MD  budesonide-formoterol (SYMBICORT) 80-4.5 MCG/ACT inhaler Inhale 2 puffs into the lungs 2 (two) times daily. 10/05/18  Yes Ziaire Hagos, Eilleen Kempf, MD  FLUoxetine (PROZAC) 20 MG tablet TAKE 1 TABLET BY MOUTH EVERY DAY IN THE MORNING AS DIRECTED Patient not taking: Reported on 12/28/2018 11/10/18   Georgina Quint, MD  mirtazapine (REMERON) 15 MG tablet Take 15 mg by mouth 3 (three) times daily.    [provider]  traZODone (DESYREL) 50 MG tablet TAKE 1 TABLET BY MOUTH EVERY DAY AT BEDTIME AS NEEDED FOR SLEEP Patient not taking: Reported on 12/28/2018 11/10/18   Georgina Quint, MD    No Known Allergies  Patient Active Problem List   Diagnosis Date Noted  . Alpha-1-antitrypsin deficiency (HCC) 03/03/2017  . COPD (chronic obstructive pulmonary disease) (HCC) 09/27/2016  . Wheezing-associated respiratory infection (WARI) 09/26/2016  . COPD (chronic obstructive pulmonary disease) with emphysema (HCC) 09/26/2016    Past Medical  History:  Diagnosis Date  . Anemia   . Bowel obstruction (HCC) 2012  . COPD (chronic obstructive pulmonary disease) (HCC)   . Depression   . History of blood transfusion     Past Surgical History:  Procedure Laterality Date  . ABDOMINAL HYSTERECTOMY    . LACERATION REPAIR Right    arm with artery involvement  . ORIF ANKLE FRACTURE Left 09/04/2015   Procedure: OPEN REDUCTION INTERNAL FIXATION (ORIF) LEFT BIOMALLEOLAR FRACTURE;  Surgeon: Toni Arthurs, MD;  Location: [facility] OR;  Service: Orthopedics;  Laterality: Left;  . SMALL INTESTINE SURGERY  2012  . TUBAL LIGATION    . VAGINAL DELIVERY     x3, epidural anesth. with 2 of them  . WISDOM TOOTH EXTRACTION      Social History   Socioeconomic History  . Marital status: Married    Spouse name: Not on file  . Number of children: Not on file  . Years of education: Not on file  . Highest education level: Not on file  Occupational History  . Occupation: Electrical engineer  Social Needs  . Financial resource strain: Not on file  . Food insecurity:    Worry: Not on file    Inability: Not on file  . Transportation needs:    Medical: Not on file    Non-medical: Not on file  Tobacco Use  . Smoking status: Never Smoker  . Smokeless tobacco: Never Used  Substance and Sexual Activity  . Alcohol use: No  . Drug use: No  .  Sexual activity: Not on file  Lifestyle  . Physical activity:    Days per week: Not on file    Minutes per session: Not on file  . Stress: Not on file  Relationships  . Social connections:    Talks on phone: Not on file    Gets together: Not on file    Attends religious service: Not on file    Active member of club or organization: Not on file    Attends meetings of clubs or organizations: Not on file    Relationship status: Not on file  . Intimate partner violence:    Fear of current or ex partner: Not on file    Emotionally abused: Not on file    Physically abused: Not on file    Forced sexual activity: Not  on file  Other Topics Concern  . Not on file  Social History Narrative   Married, lives with spouse and grandkids   3 children   OCCUPATION: security and CNA    Family History  Problem Relation Age of Onset  . Diabetes Father   . Diabetes Other   . Hypertension Other   . COPD Paternal Grandmother   . COPD Maternal Aunt      Review of Systems  Constitutional: Negative.  Negative for chills and fever.  HENT: Negative.  Negative for hearing loss.   Eyes: Negative.  Negative for blurred vision and double vision.  Respiratory: Negative.  Negative for cough and shortness of breath.   Cardiovascular: Negative.  Negative for chest pain and palpitations.  Gastrointestinal: Negative.  Negative for abdominal pain, blood in stool, diarrhea, melena, nausea and vomiting.  Genitourinary: Negative.  Negative for hematuria.  Skin: Negative.   Neurological: Negative.  Negative for dizziness and headaches.  Endo/Heme/Allergies: Negative.   Psychiatric/Behavioral: Positive for depression. Negative for suicidal ideas. The patient is nervous/anxious.   All other systems reviewed and are negative.  Vitals:   12/28/18 1036  BP: 115/80  Pulse: 79  Resp: 16  Temp: 97.9 F (36.6 C)  SpO2: 97%     Physical Exam Vitals signs reviewed.  Constitutional:      Appearance: Normal appearance.  HENT:     Head: Normocephalic and atraumatic.     Nose: Nose normal.     Mouth/Throat:     Mouth: Mucous membranes are moist.     Pharynx: Oropharynx is clear.  Eyes:     Extraocular Movements: Extraocular movements intact.     Conjunctiva/sclera: Conjunctivae normal.     Pupils: Pupils are equal, round, and reactive to light.  Neck:     Musculoskeletal: Normal range of motion and neck supple.  Cardiovascular:     Rate and Rhythm: Normal rate and regular rhythm.     Pulses: Normal pulses.     Heart sounds: Normal heart sounds.  Pulmonary:     Effort: Pulmonary effort is normal.     Breath sounds:  Normal breath sounds.  Musculoskeletal: Normal range of motion.  Skin:    General: Skin is warm and dry.     Capillary Refill: Capillary refill takes less than 2 seconds.  Neurological:     General: No focal deficit present.     Mental Status: She is alert and oriented to person, place, and time.  Psychiatric:        Mood and Affect: Mood normal.        Behavior: Behavior normal.    A total of 25 minutes was spent  in the room with the patient, greater than 50% of which was in counseling/coordination of care regarding diagnosis, management and treatment, new medications and doses, side effects, nutrition and lifestyle, and need for follow-up with psychiatry.   ASSESSMENT & PLAN: Durga was seen today for medication refill.  Diagnoses and all orders for this visit:  Moderate episode of recurrent major depressive disorder (HCC) -     DULoxetine (CYMBALTA) 30 MG capsule; Take 1 capsule (30 mg total) by mouth daily for 30 days. -     Ambulatory referral to Psychiatry  Insomnia, unspecified type -     hydrOXYzine (ATARAX/VISTARIL) 25 MG tablet; Take 1 tablet (25 mg total) by mouth at bedtime as needed.  Cervical cancer screening -     Ambulatory referral to Obstetrics / Gynecology    Patient Instructions       If you have lab work done today you will be contacted with your lab results within the next 2 weeks.  If you have not heard from [place] then please contact us. The fastest way to get your results is to register for My Chart.   IF you received an x-ray today, you will receive an invoice from [facility]. Please contact [facility] at [phone] with questions or concerns regarding your invoice.   IF you received labwork today, you will receive an invoice from [place]. Please contact [facility] at [phone] with questions or concerns regarding your invoice.   Our billing staff will not be able to assist you with questions regarding bills from these  companies.  You will be contacted with the lab results as soon as they are available. The fastest way to get your results is to activate your My Chart account. Instructions are located on the last page of this paperwork. If you have not heard from [place] regarding the results in 2 weeks, please contact this office.     Major Depressive Disorder, Adult Major depressive disorder (MDD) is a mental health condition. MDD often makes you feel sad, hopeless, or helpless. MDD can also cause symptoms in your body. MDD can affect your:  Work.  School.  Relationships.  Other normal activities. MDD can range from mild to very bad. It may occur once (single episode MDD). It can also occur many times (recurrent MDD). The main symptoms of MDD often include:  Feeling sad, depressed, or irritable most of the time.  Loss of interest. MDD symptoms also include:  Sleeping too much or too little.  Eating too much or too little.  A change in your weight.  Feeling tired (fatigue) or having low energy.  Feeling worthless.  Feeling guilty.  Trouble making decisions.  Trouble thinking clearly.  Thoughts of suicide or harming others.  Feeling weak.  Feeling agitated.  Keeping yourself from being around other people (isolation). Follow these instructions at home: Activity  Do these things as told by your doctor: ? Go back to your normal activities. ? Exercise regularly. ? Spend time outdoors. Alcohol  Talk with your doctor about how alcohol can affect your antidepressant medicines.  Do not drink alcohol. Or, limit how much alcohol you drink. ? This means no more than 1 drink a day for nonpregnant women and 2 drinks a day for men. One drink equals one of these:  12 oz of beer.  5 oz of wine.  1 oz of hard liquor. General instructions  Take over-the-counter and prescription medicines only as told by your doctor.  Eat a healthy diet.  Get plenty of sleep.  Find activities that  you enjoy. Make time to do them.  Think about joining a support group. Your doctor may be able to suggest a group for you.  Keep all follow-up visits as told by your doctor. This is important. Where to find more information:  The [employer] on Mental Illness: ? www.nami.org  [place] [employer] of Mental Health: ? http://www.maynard.net/  National Suicide Prevention Lifeline: ? 478-058-6496. This is free, 24-hour help. Contact a doctor if:  Your symptoms get worse.  You have new symptoms. Get help right away if:  You self-harm.  You see, hear, taste, smell, or feel things that are not present (hallucinate). If you ever feel like you may hurt yourself or others, or have thoughts about taking your own life, get help right away. You can go to your nearest emergency department or call:  Your local emergency services (911 in the [place]  A suicide crisis helpline, such as the National Suicide Prevention Lifeline: ? 551 044 8061. This is open 24 hours a day. This information is not intended to replace advice given to you by your health care provider. Make sure you discuss any questions you have with your health care provider. Document Released: 10/02/2015 Document Revised: 07/07/2016 Document Reviewed: 07/07/2016 Elsevier Interactive Patient Education  2019 Elsevier Inc.      Edwina Barth, MD [facility]

## 2018-12-28 NOTE — Patient Instructions (Addendum)
   If you have lab work done today you will be contacted with your lab results within the next 2 weeks.  If you have not heard from us then please contact us. The fastest way to get your results is to register for My Chart.   IF you received an x-ray today, you will receive an invoice from [facility]. Please contact [facility] at [phone] with questions or concerns regarding your invoice.   IF you received labwork today, you will receive an invoice from [facility]. Please contact [facility] at [phone] with questions or concerns regarding your invoice.   Our billing staff will not be able to assist you with questions regarding bills from these companies.  You will be contacted with the lab results as soon as they are available. The fastest way to get your results is to activate your My Chart account. Instructions are located on the last page of this paperwork. If you have not heard from us regarding the results in 2 weeks, please contact this office.     Major Depressive Disorder, Adult Major depressive disorder (MDD) is a mental health condition. MDD often makes you feel sad, hopeless, or helpless. MDD can also cause symptoms in your body. MDD can affect your:  Work.  School.  Relationships.  Other normal activities. MDD can range from mild to very bad. It may occur once (single episode MDD). It can also occur many times (recurrent MDD). The main symptoms of MDD often include:  Feeling sad, depressed, or irritable most of the time.  Loss of interest. MDD symptoms also include:  Sleeping too much or too little.  Eating too much or too little.  A change in your weight.  Feeling tired (fatigue) or having low energy.  Feeling worthless.  Feeling guilty.  Trouble making decisions.  Trouble thinking clearly.  Thoughts of suicide or harming others.  Feeling weak.  Feeling agitated.  Keeping yourself from being around other people  (isolation). Follow these instructions at home: Activity  Do these things as told by your doctor: ? Go back to your normal activities. ? Exercise regularly. ? Spend time outdoors. Alcohol  Talk with your doctor about how alcohol can affect your antidepressant medicines.  Do not drink alcohol. Or, limit how much alcohol you drink. ? This means no more than 1 drink a day for nonpregnant women and 2 drinks a day for men. One drink equals one of these:  12 oz of beer.  5 oz of wine.  1 oz of hard liquor. General instructions  Take over-the-counter and prescription medicines only as told by your doctor.  Eat a healthy diet.  Get plenty of sleep.  Find activities that you enjoy. Make time to do them.  Think about joining a support group. Your doctor may be able to suggest a group for you.  Keep all follow-up visits as told by your doctor. This is important. Where to find more information:  National Alliance on Mental Illness: ? www.nami.org  [place].S[place] National Institute of Mental Health: ? www.nimh.nih.gov  National Suicide Prevention Lifeline: ? 1-800-273-8255. This is free, 24-hour help. Contact a doctor if:  Your symptoms get worse.  You have new symptoms. Get help right away if:  You self-harm.  You see, hear, taste, smell, or feel things that are not present (hallucinate). If you ever feel like you may hurt yourself or others, or have thoughts about taking your own life, get help right away. You can go to your   nearest emergency department or call:  Your local emergency services (911 in the [place]  A suicide crisis helpline, such as the National Suicide Prevention Lifeline: ? 1-800-273-8255. This is open 24 hours a day. This information is not intended to replace advice given to you by your health care provider. Make sure you discuss any questions you have with your health care provider. Document Released: 10/02/2015 Document Revised: 07/07/2016 Document  Reviewed: 07/07/2016 Elsevier Interactive Patient Education  2019 Elsevier Inc.  

## 2018-12-29 ENCOUNTER — Other Ambulatory Visit: Payer: Self-pay

## 2018-12-29 ENCOUNTER — Ambulatory Visit (INDEPENDENT_AMBULATORY_CARE_PROVIDER_SITE_OTHER): Payer: BLUE CROSS/BLUE SHIELD | Admitting: Family Medicine

## 2018-12-29 ENCOUNTER — Encounter: Payer: Self-pay | Admitting: Family Medicine

## 2018-12-29 VITALS — BP 111/76 | HR 85 | Temp 98.2°F | Resp 18 | Ht 66.0 in | Wt 170.0 lb

## 2018-12-29 DIAGNOSIS — J029 Acute pharyngitis, unspecified: Secondary | ICD-10-CM | POA: Diagnosis not present

## 2018-12-29 DIAGNOSIS — R6889 Other general symptoms and signs: Secondary | ICD-10-CM | POA: Diagnosis not present

## 2018-12-29 LAB — POCT RAPID STREP A (OFFICE): Rapid Strep A Screen: NEGATIVE

## 2018-12-29 MED ORDER — OSELTAMIVIR PHOSPHATE 75 MG PO CAPS: 75.0000 mg | ORAL_CAPSULE | Freq: Two times a day (BID) | ORAL | 0 refills | Status: AC

## 2018-12-29 NOTE — Progress Notes (Signed)
Patient ID: Evelyn Kline, female    DOB: Jan 05, 1971  Age: 48 y.o. MRN: [id number]  Chief Complaint  Patient presents with  . Sore Throat    since yesterday   . Emesis  . 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  She got sick yesterday.  She has had a sore throat, nasal congestion, cough.  She does have some headache and body ache.  She felt so sick she could not hardly keep her eyes open.  She does not have a fever here, but says she did have one.  She is throwing up.  She feels terrible.  Current allergies, medications, problem list, past/family and social histories reviewed.  Objective:  BP 111/76   Pulse 85   Temp 98.2 F (36.8 C) (Oral)   Resp 18   Ht 5\' 6"  (1.676 m)   Wt 170 lb (77.1 kg)   SpO2 94%   BMI 27.44 kg/m   Obviously looks ill.  Her TMs are normal.  Throat only mildly erythematous.  Neck supple with small anterior cervical nodes that are tender.  Chest clear to auscultation.  Heart regular without murmurs.  She does cough easily.  Everybody at work is had the flu.  Strep test is negative.  Assessment & Plan:   Assessment: 1. Flu-like symptoms   2. Sore throat       Plan: Clinically the patient has the flu, and even if the flu test came back negative I think I would treat her with Tamiflu, so I am not testing her but going ahead and treating today.  See instructions.  Orders Placed This Encounter  Procedures  . POCT rapid strep A    No orders of the defined types were placed in this encounter.        Patient Instructions    Plenty of fluids and get enough rest  Take Tamiflu 75 mg 1 twice daily for 5 days  Take an over-the-counter DM-containing cough syrup such as Robitussin-DM or Mucinex DM or Delsym or a generic of 1 of those as needed for cough.  Tylenol 500 mg 2 pills 3 times daily and/or ibuprofen 200 mg 3 pills 3 times daily if needed for fever or body ache or headache.  Stay off work until least  Friday.  Make sure you are fever free for 24 hours before returning to work.  You should try and monitor yourself at home with a thermometer.  Return at any time if getting abruptly worse or go to the emergency room if necessary.   If you have lab work done today you will be contacted with your lab results within the next 2 weeks.  If you have not heard from [place] then please contact us. The fastest way to get your results is to register for My Chart.   IF you received an x-ray today, you will receive an invoice from [facility]. Please contact [facility] at [phone] with questions or concerns regarding your invoice.   IF you received labwork today, you will receive an invoice from [place]. Please contact [facility] at [phone] with questions or concerns regarding your invoice.   Our billing staff will not be able to assist you with questions regarding bills from these companies.  You will be contacted with the lab results as soon as they are available. The fastest way to get your results is to activate your My Chart account. Instructions are located on the last  page of this paperwork. If you have not heard from [place] regarding the results in 2 weeks, please contact this office.        Return if symptoms worsen or fail to improve.   Janace Hoard, MD 12/29/2018

## 2018-12-29 NOTE — Patient Instructions (Addendum)
  Plenty of fluids and get enough rest  Take Tamiflu 75 mg 1 twice daily for 5 days  Take an over-the-counter DM-containing cough syrup such as Robitussin-DM or Mucinex DM or Delsym or a generic of 1 of those as needed for cough.  Tylenol 500 mg 2 pills 3 times daily and/or ibuprofen 200 mg 3 pills 3 times daily if needed for fever or body ache or headache.  Stay off work until least Friday.  Make sure you are fever free for 24 hours before returning to work.  You should try and monitor yourself at home with a thermometer.  Return at any time if getting abruptly worse or go to the emergency room if necessary.   If you have lab work done today you will be contacted with your lab results within the next 2 weeks.  If you have not heard from [place] then please contact us. The fastest way to get your results is to register for My Chart.   IF you received an x-ray today, you will receive an invoice from [facility]. Please contact [facility] at [phone] with questions or concerns regarding your invoice.   IF you received labwork today, you will receive an invoice from [place]. Please contact [facility] at [phone] with questions or concerns regarding your invoice.   Our billing staff will not be able to assist you with questions regarding bills from these companies.  You will be contacted with the lab results as soon as they are available. The fastest way to get your results is to activate your My Chart account. Instructions are located on the last page of this paperwork. If you have not heard from [place] regarding the results in 2 weeks, please contact this office.

## 2018-12-31 ENCOUNTER — Ambulatory Visit: Payer: BLUE CROSS/BLUE SHIELD | Admitting: Family Medicine

## 2019-01-20 DIAGNOSIS — F314 Bipolar disorder, current episode depressed, severe, without psychotic features: Secondary | ICD-10-CM | POA: Diagnosis not present

## 2019-01-20 DIAGNOSIS — F411 Generalized anxiety disorder: Secondary | ICD-10-CM | POA: Diagnosis not present

## 2019-02-24 ENCOUNTER — Ambulatory Visit: Payer: Medicaid Other | Admitting: Obstetrics and Gynecology

## 2019-02-28 ENCOUNTER — Telehealth: Payer: Self-pay | Admitting: Family Medicine

## 2019-02-28 DIAGNOSIS — R0982 Postnasal drip: Secondary | ICD-10-CM | POA: Diagnosis not present

## 2019-02-28 DIAGNOSIS — R05 Cough: Secondary | ICD-10-CM | POA: Diagnosis not present

## 2019-02-28 DIAGNOSIS — J029 Acute pharyngitis, unspecified: Secondary | ICD-10-CM | POA: Diagnosis not present

## 2019-02-28 NOTE — Telephone Encounter (Signed)
Call from answering service.  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   Spoke with patient.  Sore throat, cough, sneezing started 3-4 days ago. Some HA, min bodyaches - back. Hx of COPD, min allergies in past. No eye itching/watering. No fever, not short of breathNo wheezing - does not feel like COPD. Has been using symbicort, no use of albuterol as no wheeze.  No known contact with coronavirus, but works at [employer] and [employer]. speking in full sentences - no respiratory distress.   Advised eval today at Urgent Care for possible Covid testing given healthcare work, although no fever/dyspnea and 3-4 days into symptoms. They can also eval cough as COPD flair possible. Out of work at this time until advised by medical provider (discussed at least 7 days, afebrile for 3 days, and improving sx's needed).  Understanding expressed.

## 2019-03-01 ENCOUNTER — Telehealth: Payer: Self-pay | Admitting: Emergency Medicine

## 2019-03-01 NOTE — Telephone Encounter (Signed)
Copied from CRM [id number]. Topic: Quick Communication - Rx Refill/Question >> Mar 01, 2019  8:26 AM Reggie Pile, NT wrote: Medication:  albuterol (PROVENTIL HFA;VENTOLIN HFA) 108 (90 Base) MCG/ACT inhaler   Has the patient contacted their pharmacy? Yes patient stated she has not had one for a bit but she was told to get a refill with everything going on.   Preferred Pharmacy (with phone number or street name):  [facility] [phone] [place], [place] - [address] [place] [address] [phone] (Phone) [phone] (Fax)  Agent: Please be advised that RX refills may take up to 3 business days. We ask that you follow-up with your pharmacy.

## 2019-03-03 NOTE — Telephone Encounter (Signed)
Needs a tele med apt for refills due to not having it filled since 2017.

## 2019-04-01 NOTE — Telephone Encounter (Signed)
LVM ADV PATIENT TO CALL IN IF SHE WOULD STILL LIKE TO SCHEDULE AN APPOINTMENT

## 2019-04-30 ENCOUNTER — Emergency Department (HOSPITAL_COMMUNITY): Payer: BC Managed Care – PPO

## 2019-04-30 ENCOUNTER — Other Ambulatory Visit: Payer: Self-pay

## 2019-04-30 ENCOUNTER — Emergency Department (HOSPITAL_COMMUNITY)
Admission: EM | Admit: 2019-04-30 | Discharge: 2019-04-30 | Disposition: A | Discharge status: Home / Self Care | Payer: BC Managed Care – PPO | Attending: Emergency Medicine | Admitting: Emergency Medicine

## 2019-04-30 DIAGNOSIS — R079 Chest pain, unspecified: Secondary | ICD-10-CM | POA: Diagnosis not present

## 2019-04-30 DIAGNOSIS — R0789 Other chest pain: Secondary | ICD-10-CM | POA: Insufficient documentation

## 2019-04-30 DIAGNOSIS — R51 Headache: Secondary | ICD-10-CM | POA: Diagnosis not present

## 2019-04-30 DIAGNOSIS — R1111 Vomiting without nausea: Secondary | ICD-10-CM | POA: Diagnosis not present

## 2019-04-30 DIAGNOSIS — J449 Chronic obstructive pulmonary disease, unspecified: Secondary | ICD-10-CM | POA: Diagnosis not present

## 2019-04-30 DIAGNOSIS — Z79899 Other long term (current) drug therapy: Secondary | ICD-10-CM | POA: Insufficient documentation

## 2019-04-30 LAB — CBC
HCT: 49.1 % — ABNORMAL HIGH (ref 36.0–46.0)
Hemoglobin: 16.3 g/dL — ABNORMAL HIGH (ref 12.0–15.0)
MCH: 28.3 pg (ref 26.0–34.0)
MCHC: 33.2 g/dL (ref 30.0–36.0)
MCV: 85.2 fL (ref 80.0–100.0)
Platelets: 293 10*3/uL (ref 150–400)
RBC: 5.76 MIL/uL — ABNORMAL HIGH (ref 3.87–5.11)
RDW: 12.8 % (ref 11.5–15.5)
WBC: 8.9 10*3/uL (ref 4.0–10.5)
nRBC: 0 % (ref 0.0–0.2)

## 2019-04-30 LAB — BASIC METABOLIC PANEL
Anion gap: 12 (ref 5–15)
BUN: 7 mg/dL (ref 6–20)
CO2: 22 mmol/L (ref 22–32)
Calcium: 9.6 mg/dL (ref 8.9–10.3)
Chloride: 105 mmol/L (ref 98–111)
Creatinine, Ser: 0.65 mg/dL (ref 0.44–1.00)
GFR calc Af Amer: >60 mL/min (ref >60)
GFR calc non Af Amer: >60 mL/min (ref >60)
Glucose, Bld: 110 mg/dL — ABNORMAL HIGH (ref 70–99)
Potassium: 3.8 mmol/L (ref 3.5–5.1)
Sodium: 139 mmol/L (ref 135–145)

## 2019-04-30 LAB — TROPONIN I (HIGH SENSITIVITY)
Troponin I (High Sensitivity): 6 ng/L (ref <18)
Troponin I (High Sensitivity): 7 ng/L (ref <18)

## 2019-04-30 MED ORDER — SUCRALFATE 1 G PO TABS: 1.0000 g | ORAL_TABLET | Freq: Three times a day (TID) | ORAL | 0 refills | Status: AC

## 2019-04-30 MED ORDER — SUCRALFATE 1 G PO TABS
1.0000 g | ORAL_TABLET | Freq: Once | ORAL | Status: AC
  Administered 2019-04-30: 1 g via ORAL
  Filled 2019-04-30: qty 1

## 2019-04-30 MED ORDER — SODIUM CHLORIDE 0.9% FLUSH: 3.0000 mL | Freq: Once | INTRAVENOUS | Status: DC

## 2019-04-30 MED ORDER — NAPROXEN 250 MG PO TABS
500.0000 mg | ORAL_TABLET | Freq: Once | ORAL | Status: AC
  Administered 2019-04-30: 500 mg via ORAL
  Filled 2019-04-30: qty 2

## 2019-04-30 NOTE — ED Triage Notes (Addendum)
Patient in via [place] from [place] (voluntary there for SI) - EMS called out d/t patient having intermittent chest pain and headache since yesterday. Patient more concerned about her headache - states pain became bad and she vomited once prior to arrival. Patient in no apparent distress in triage, texting on cell phone. Denies shortness of breath, dizziness, abd pain, vision changes. Resp e/u, skin w/d. No history of same.   EMS VS: 132/90, HR 84 NSR, RR 16, 98% RA.

## 2019-04-30 NOTE — ED Triage Notes (Signed)
Per EMS, patient to go back to [facility] once discharged - [facility] may take her back.

## 2019-04-30 NOTE — ED Notes (Signed)
Per [facility] patient is welcome to come back there after being medically cleared.

## 2019-04-30 NOTE — Discharge Instructions (Addendum)
If your pain worsens or you have any change in symptoms or concerns please seek additional medical care and evaluation.  You may return to the ER if needed.   You have a prescription for Carafate.  This will help coat your stomach.

## 2019-04-30 NOTE — ED Notes (Signed)
Report called to [place] at [facility].  Patient to return via [facility].

## 2019-05-01 NOTE — ED Provider Notes (Signed)
[facility] Provider Note   CSN: [id number] Arrival date & time: 04/30/19  1617    History   Chief Complaint Chief Complaint  Patient presents with  . Chest Pain  . Headache    HPI Evelyn Pasty ArchJ Kline is a 48 y.o. female.  HPI: A 48 year old patient with a history of obesity presents for evaluation of chest pain. Initial onset of pain was approximately 3-6 hours ago. The patient's chest pain is described as heaviness/pressure/tightness, is sharp and is not worse with exertion. The patient's chest pain is middle- or left-sided, is not well-localized and does not radiate to the arms/jaw/neck. The patient does not complain of nausea and denies diaphoresis. The patient has smoked in the past 90 days. The patient has no history of stroke, has no history of peripheral artery disease, denies any history of treated diabetes, has no relevant family history of coronary artery disease (first degree relative at less than age 46), is not hypertensive and has no history of hypercholesterolemia.   Additionally she is also complaining of a headache.  She noted the headache [facility]  At that time her blood pressure was 140s, and the provider there thought that with her headache, intermittent chest pains that have been going on for 2 to 3 days and elevated blood pressure she needed to come to the ER.  HPI  Past Medical History:  Diagnosis Date  . Anemia   . Bowel obstruction (HCC) 2012  . COPD (chronic obstructive pulmonary disease) (HCC)   . Depression   . History of blood transfusion     Patient Active Problem List   Diagnosis Date Noted  . Alpha-1-antitrypsin deficiency (HCC) 03/03/2017  . COPD (chronic obstructive pulmonary disease) (HCC) 09/27/2016  . Wheezing-associated respiratory infection (WARI) 09/26/2016  . COPD (chronic obstructive pulmonary disease) with emphysema (HCC) 09/26/2016    Past Surgical History:  Procedure  Laterality Date  . ABDOMINAL HYSTERECTOMY    . LACERATION REPAIR Right    arm with artery involvement  . ORIF ANKLE FRACTURE Left 09/04/2015   Procedure: OPEN REDUCTION INTERNAL FIXATION (ORIF) LEFT BIOMALLEOLAR FRACTURE;  Surgeon: Toni ArthursJohn Hewitt, MD;  Location: [facility] OR;  Service: Orthopedics;  Laterality: Left;  . SMALL INTESTINE SURGERY  2012  . TUBAL LIGATION    . VAGINAL DELIVERY     x3, epidural anesth. with 2 of them  . WISDOM TOOTH EXTRACTION       OB History   No obstetric history on file.      Home Medications    Prior to Admission medications   Medication Sig Start Date End Date Taking? Authorizing Provider  albuterol (PROVENTIL HFA;VENTOLIN HFA) 108 (90 Base) MCG/ACT inhaler Inhale 1-2 puffs into the lungs every 6 (six) hours as needed for wheezing or shortness of breath. 10/17/16   Mannam, Colbert CoyerPraveen, MD  budesonide-formoterol (SYMBICORT) 80-4.5 MCG/ACT inhaler Inhale 2 puffs into the lungs 2 (two) times daily. 10/05/18   Georgina QuintSagardia, Miguel Jose, MD  DULoxetine (CYMBALTA) 30 MG capsule Take 1 capsule (30 mg total) by mouth daily for 30 days. 12/28/18 04/30/19  Georgina QuintSagardia, Miguel Jose, MD  hydrOXYzine (ATARAX/VISTARIL) 25 MG tablet Take 1 tablet (25 mg total) by mouth at bedtime as needed. 12/28/18   Georgina QuintSagardia, Miguel Jose, MD  oseltamivir (TAMIFLU) 75 MG capsule Take 1 capsule (75 mg total) by mouth 2 (two) times daily. 12/29/18   Peyton NajjarHopper, David H, MD  sucralfate (CARAFATE) 1 g tablet Take 1 tablet (  1 g total) by mouth 4 (four) times daily -  with meals and at bedtime for 14 days. 04/30/19 05/14/19  Cristina GongHammond, Elizabeth W, PA-C    Family History Family History  Problem Relation Age of Onset  . Diabetes Father   . Diabetes Other   . Hypertension Other   . COPD Paternal Grandmother   . COPD Maternal Aunt     Social History Social History   Tobacco Use  . Smoking status: Never Smoker  . Smokeless tobacco: Never Used  Substance Use Topics  . Alcohol use: No  . Drug use: No      Allergies   Patient has no known allergies.   Review of Systems Review of Systems  Constitutional: Positive for activity change.  Respiratory: Negative for shortness of breath.   Cardiovascular: Positive for chest pain.  Neurological: Positive for headaches.  All other systems reviewed and are negative.    Physical Exam Updated Vital Signs BP 103/71   Pulse 73   Temp 97.9 F (36.6 C) (Oral)   Resp 15   SpO2 90%   Physical Exam Vitals signs and nursing note reviewed.  Constitutional:      Appearance: She is well-developed.  HENT:     Head: Normocephalic and atraumatic.  Neck:     Musculoskeletal: Normal range of motion and neck supple.  Cardiovascular:     Rate and Rhythm: Normal rate.  Pulmonary:     Effort: Pulmonary effort is normal.  Abdominal:     General: Bowel sounds are normal.  Skin:    General: Skin is warm and dry.  Neurological:     General: No focal deficit present.     Mental Status: She is alert and oriented to person, place, and time.     Cranial Nerves: No cranial nerve deficit.     Motor: No weakness.      ED Treatments / Results  Labs (all labs ordered are listed, but only abnormal results are displayed) Labs Reviewed  BASIC METABOLIC PANEL - Abnormal; Notable for the following components:      Result Value   Glucose, Bld 110 (*)    All other components within normal limits  CBC - Abnormal; Notable for the following components:   RBC 5.76 (*)    Hemoglobin 16.3 (*)    HCT 49.1 (*)    All other components within normal limits  TROPONIN I (HIGH SENSITIVITY)  TROPONIN I (HIGH SENSITIVITY)    EKG None  Radiology Dg Chest 2 View  Result Date: 04/30/2019 CLINICAL DATA:  Intermittent chest pain since yesterday EXAM: CHEST - 2 VIEW COMPARISON:  09/30/2016 FINDINGS: Normal heart size, mediastinal contours, and pulmonary vascularity. Lungs clear. No pulmonary infiltrate, pleural effusion or pneumothorax. Bones unremarkable. IMPRESSION:  No acute abnormalities. Electronically Signed   By: Ulyses SouthwardMark  Boles M.D.   On: 04/30/2019 17:22    Procedures Procedures (including critical care time)  Medications Ordered in ED Medications  sucralfate (CARAFATE) tablet 1 g (1 g Oral Given 04/30/19 2152)  naproxen (NAPROSYN) tablet 500 mg (500 mg Oral Given 04/30/19 2152)     Initial Impression / Assessment and Plan / ED Course  I have reviewed the triage vital signs and the nursing notes.  Pertinent labs & imaging results that were available during my care of the patient were reviewed by me and considered in my medical decision making (see chart for details).     HEAR Score: 621  48 year old woman comes in a chief  complaint of headache and chest pain.  Intermittent chest pains have been present for the last 2 to 3 days, they are unprovoked and there is no specific aggravating or relieving factors.  Patient's pain is atypical and there is no concerning associated constitutional's.  She is also having headache.  At one point she had nausea and vomiting.  At the moment she has no focal neurologic symptoms or deficits on exam.  She states that her headache is better.  BP in the ER have been all within normal limits.  Serial troponins are not indicating any myocardial injury.  No concerns for ACS.  Stable for discharge.  Final Clinical Impressions(s) / ED Diagnoses   Final diagnoses:  Atypical chest pain    ED Discharge Orders         Ordered    sucralfate (CARAFATE) 1 g tablet  3 times daily with meals & bedtime     04/30/19 2155           Varney Biles, MD 05/01/19 1550

## 2019-05-04 ENCOUNTER — Encounter: Payer: BC Managed Care – PPO | Admitting: Advanced Practice Midwife

## 2019-05-14 DIAGNOSIS — F319 Bipolar disorder, unspecified: Secondary | ICD-10-CM | POA: Diagnosis not present

## 2019-05-14 DIAGNOSIS — F439 Reaction to severe stress, unspecified: Secondary | ICD-10-CM | POA: Diagnosis not present

## 2019-05-14 DIAGNOSIS — F603 Borderline personality disorder: Secondary | ICD-10-CM | POA: Diagnosis not present

## 2019-05-14 DIAGNOSIS — F4323 Adjustment disorder with mixed anxiety and depressed mood: Secondary | ICD-10-CM | POA: Diagnosis not present

## 2019-06-02 ENCOUNTER — Other Ambulatory Visit: Payer: Self-pay | Admitting: *Deleted

## 2019-06-02 ENCOUNTER — Other Ambulatory Visit: Payer: Self-pay | Admitting: Emergency Medicine

## 2019-06-02 DIAGNOSIS — J988 Other specified respiratory disorders: Secondary | ICD-10-CM

## 2019-06-02 DIAGNOSIS — J439 Emphysema, unspecified: Secondary | ICD-10-CM

## 2019-06-02 NOTE — Telephone Encounter (Signed)
Requested Prescriptions  Pending Prescriptions Disp Refills  . SYMBICORT 80-4.5 MCG/ACT inhaler [Pharmacy Med Name: SYMBICORT 80-4.5 MCG INHALER] 10.2 Inhaler 3    Sig: TAKE 2 PUFFS BY MOUTH TWICE A DAY     There is no refill protocol information for this order

## 2019-06-04 DIAGNOSIS — F439 Reaction to severe stress, unspecified: Secondary | ICD-10-CM | POA: Diagnosis not present

## 2019-06-04 DIAGNOSIS — F319 Bipolar disorder, unspecified: Secondary | ICD-10-CM | POA: Diagnosis not present

## 2019-06-04 DIAGNOSIS — F603 Borderline personality disorder: Secondary | ICD-10-CM | POA: Diagnosis not present

## 2019-06-04 DIAGNOSIS — F4323 Adjustment disorder with mixed anxiety and depressed mood: Secondary | ICD-10-CM | POA: Diagnosis not present

## 2019-06-28 DIAGNOSIS — F319 Bipolar disorder, unspecified: Secondary | ICD-10-CM | POA: Diagnosis not present

## 2019-06-28 DIAGNOSIS — F603 Borderline personality disorder: Secondary | ICD-10-CM | POA: Diagnosis not present

## 2019-06-28 DIAGNOSIS — F4323 Adjustment disorder with mixed anxiety and depressed mood: Secondary | ICD-10-CM | POA: Diagnosis not present

## 2019-07-14 DIAGNOSIS — F603 Borderline personality disorder: Secondary | ICD-10-CM | POA: Diagnosis not present

## 2019-07-14 DIAGNOSIS — F319 Bipolar disorder, unspecified: Secondary | ICD-10-CM | POA: Diagnosis not present

## 2019-07-14 DIAGNOSIS — F4323 Adjustment disorder with mixed anxiety and depressed mood: Secondary | ICD-10-CM | POA: Diagnosis not present

## 2019-07-26 DIAGNOSIS — F603 Borderline personality disorder: Secondary | ICD-10-CM | POA: Diagnosis not present

## 2019-07-26 DIAGNOSIS — F4323 Adjustment disorder with mixed anxiety and depressed mood: Secondary | ICD-10-CM | POA: Diagnosis not present

## 2019-07-26 DIAGNOSIS — F439 Reaction to severe stress, unspecified: Secondary | ICD-10-CM | POA: Diagnosis not present

## 2019-07-26 DIAGNOSIS — F319 Bipolar disorder, unspecified: Secondary | ICD-10-CM | POA: Diagnosis not present

## 2019-08-09 DIAGNOSIS — F439 Reaction to severe stress, unspecified: Secondary | ICD-10-CM | POA: Diagnosis not present

## 2019-08-09 DIAGNOSIS — F319 Bipolar disorder, unspecified: Secondary | ICD-10-CM | POA: Diagnosis not present

## 2019-08-09 DIAGNOSIS — F603 Borderline personality disorder: Secondary | ICD-10-CM | POA: Diagnosis not present

## 2019-08-09 DIAGNOSIS — F4323 Adjustment disorder with mixed anxiety and depressed mood: Secondary | ICD-10-CM | POA: Diagnosis not present

## 2019-10-03 IMAGING — DX CHEST - 2 VIEW
2 series · 2 of 2 positions shown · non-contrast
Comparison: 09/30/2016

CLINICAL DATA: Intermittent chest pain since yesterday

EXAM:
CHEST - 2 VIEW

[chest pa]
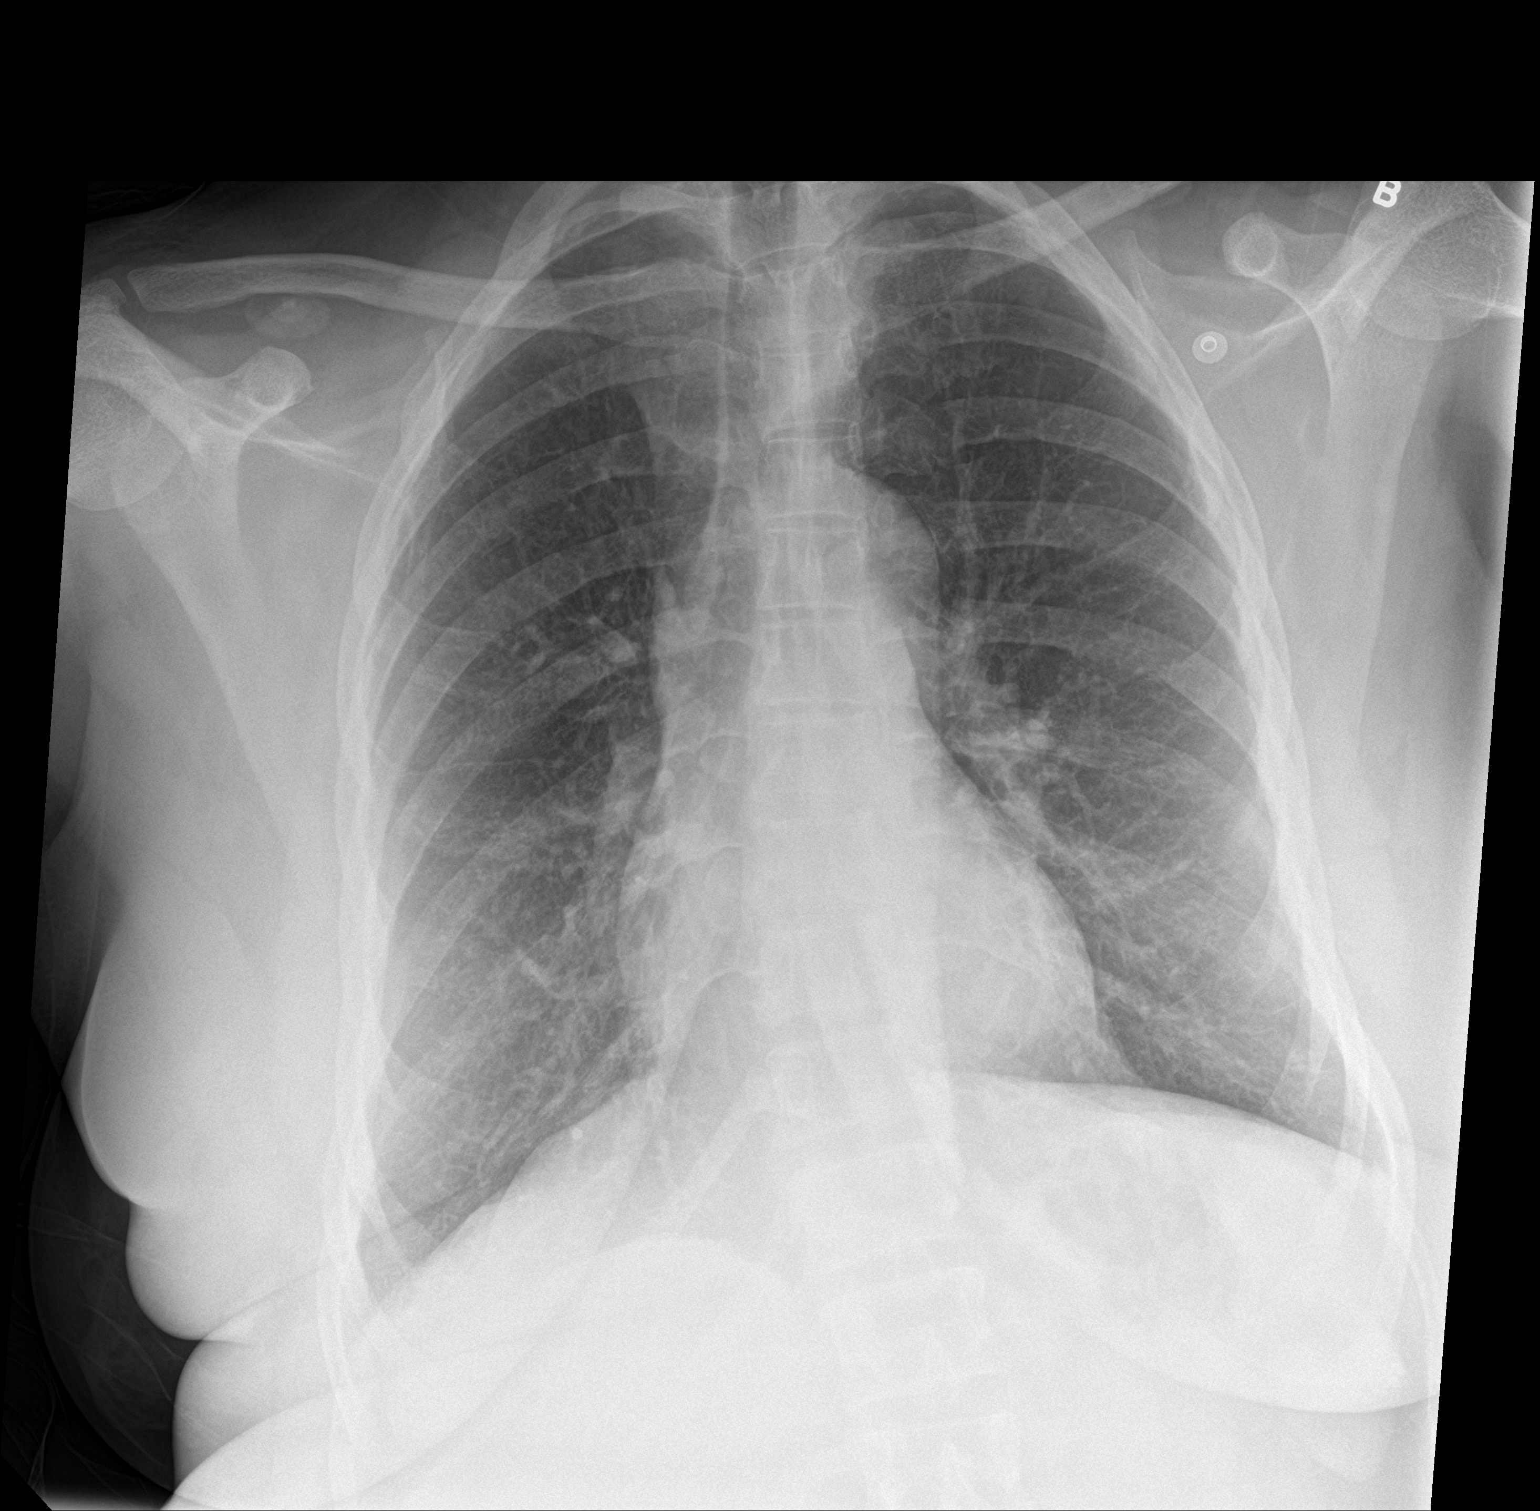

[chest lat]
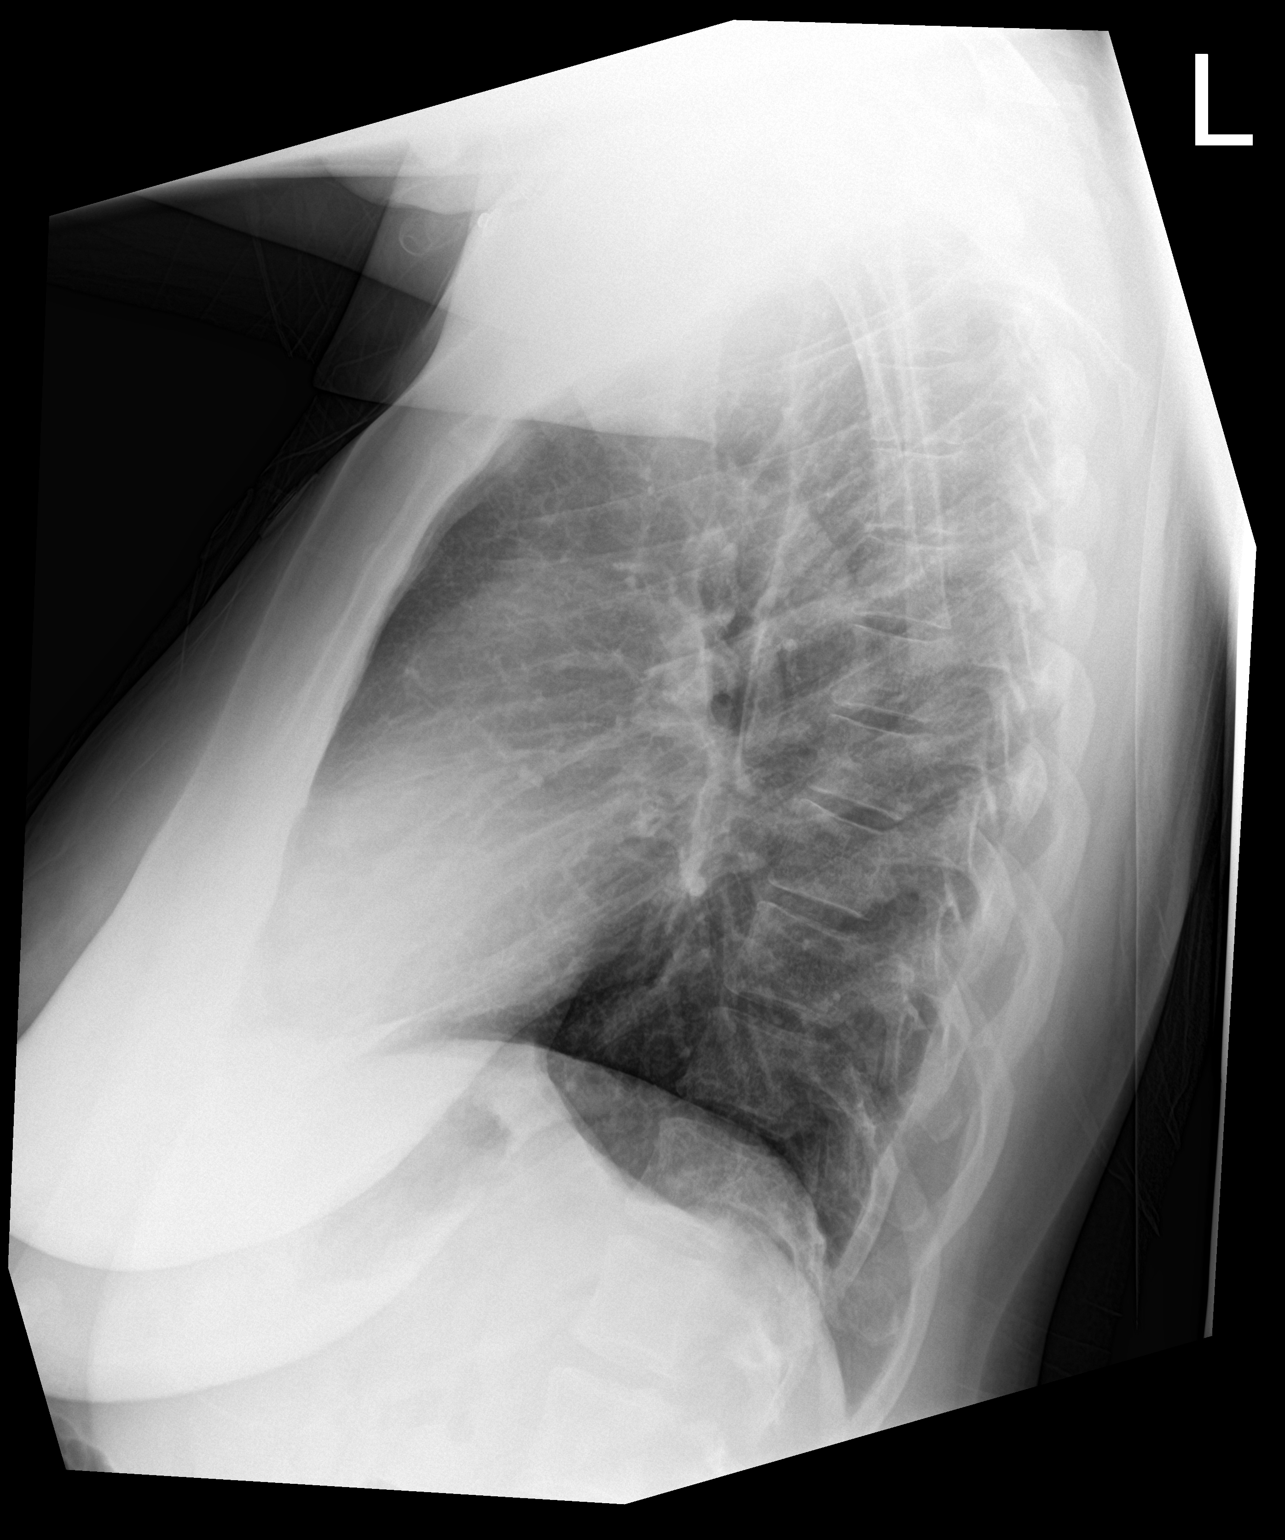

[2 of 2 positions shown; findings below may reference images not displayed]

FINDINGS: Normal heart size, mediastinal contours, and pulmonary vascularity.

Lungs clear.

No pulmonary infiltrate, pleural effusion or pneumothorax.

Bones unremarkable.
IMPRESSION: No acute abnormalities.

## 2019-10-21 DIAGNOSIS — F4323 Adjustment disorder with mixed anxiety and depressed mood: Secondary | ICD-10-CM | POA: Diagnosis not present

## 2019-10-21 DIAGNOSIS — F319 Bipolar disorder, unspecified: Secondary | ICD-10-CM | POA: Diagnosis not present

## 2019-10-21 DIAGNOSIS — F439 Reaction to severe stress, unspecified: Secondary | ICD-10-CM | POA: Diagnosis not present

## 2019-10-21 DIAGNOSIS — F603 Borderline personality disorder: Secondary | ICD-10-CM | POA: Diagnosis not present

## 2019-11-08 DIAGNOSIS — F319 Bipolar disorder, unspecified: Secondary | ICD-10-CM | POA: Diagnosis not present

## 2019-11-08 DIAGNOSIS — F4323 Adjustment disorder with mixed anxiety and depressed mood: Secondary | ICD-10-CM | POA: Diagnosis not present

## 2019-11-08 DIAGNOSIS — F603 Borderline personality disorder: Secondary | ICD-10-CM | POA: Diagnosis not present

## 2019-11-08 DIAGNOSIS — F439 Reaction to severe stress, unspecified: Secondary | ICD-10-CM | POA: Diagnosis not present

## 2019-12-01 DIAGNOSIS — J449 Chronic obstructive pulmonary disease, unspecified: Secondary | ICD-10-CM | POA: Diagnosis not present

## 2019-12-01 DIAGNOSIS — R0682 Tachypnea, not elsewhere classified: Secondary | ICD-10-CM | POA: Diagnosis not present

## 2019-12-01 DIAGNOSIS — I498 Other specified cardiac arrhythmias: Secondary | ICD-10-CM | POA: Diagnosis not present

## 2019-12-01 DIAGNOSIS — B9689 Other specified bacterial agents as the cause of diseases classified elsewhere: Secondary | ICD-10-CM | POA: Diagnosis not present

## 2019-12-01 DIAGNOSIS — Z20822 Contact with and (suspected) exposure to covid-19: Secondary | ICD-10-CM | POA: Diagnosis not present

## 2019-12-01 DIAGNOSIS — R0602 Shortness of breath: Secondary | ICD-10-CM | POA: Diagnosis not present

## 2019-12-01 DIAGNOSIS — J209 Acute bronchitis, unspecified: Secondary | ICD-10-CM | POA: Diagnosis not present

## 2019-12-22 ENCOUNTER — Observation Stay (HOSPITAL_COMMUNITY)
Admission: AD | Admit: 2019-12-22 | Discharge: 2019-12-22 | Disposition: A | Discharge status: Home / Self Care | Payer: BC Managed Care – PPO | Attending: Psychiatry | Admitting: Psychiatry

## 2019-12-22 ENCOUNTER — Encounter (HOSPITAL_COMMUNITY): Payer: Self-pay | Admitting: Psychiatry

## 2019-12-22 DIAGNOSIS — Z90710 Acquired absence of both cervix and uterus: Secondary | ICD-10-CM | POA: Diagnosis not present

## 2019-12-22 DIAGNOSIS — Z825 Family history of asthma and other chronic lower respiratory diseases: Secondary | ICD-10-CM | POA: Insufficient documentation

## 2019-12-22 DIAGNOSIS — F319 Bipolar disorder, unspecified: Secondary | ICD-10-CM | POA: Diagnosis not present

## 2019-12-22 DIAGNOSIS — F419 Anxiety disorder, unspecified: Secondary | ICD-10-CM | POA: Insufficient documentation

## 2019-12-22 DIAGNOSIS — Z833 Family history of diabetes mellitus: Secondary | ICD-10-CM | POA: Diagnosis not present

## 2019-12-22 DIAGNOSIS — Z8249 Family history of ischemic heart disease and other diseases of the circulatory system: Secondary | ICD-10-CM | POA: Insufficient documentation

## 2019-12-22 DIAGNOSIS — J449 Chronic obstructive pulmonary disease, unspecified: Secondary | ICD-10-CM | POA: Diagnosis not present

## 2019-12-22 DIAGNOSIS — R451 Restlessness and agitation: Principal | ICD-10-CM | POA: Insufficient documentation

## 2019-12-22 MED ORDER — ZIPRASIDONE MESYLATE 20 MG IM SOLR: 20.0000 mg | INTRAMUSCULAR | Status: DC | PRN

## 2019-12-22 MED ORDER — LORAZEPAM 1 MG PO TABS: 1.0000 mg | ORAL_TABLET | ORAL | Status: DC | PRN

## 2019-12-22 MED ORDER — OLANZAPINE 10 MG PO TBDP: 10.0000 mg | ORAL_TABLET | Freq: Three times a day (TID) | ORAL | Status: DC | PRN
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
## 2019-12-22 NOTE — Discharge Summary (Signed)
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 *   Past Psychiatric History: BPD  Past Medical History:  Past Medical History:  Diagnosis Date  . Anemia   . Bowel obstruction ([place]) 2012  . COPD (chronic obstructive pulmonary disease) ([place])   . Depression   . History of blood transfusion     Past Surgical History:  Procedure Laterality Date  . ABDOMINAL HYSTERECTOMY    . LACERATION REPAIR Right    arm with artery involvement  . ORIF ANKLE FRACTURE Left 09/04/2015   Procedure: OPEN REDUCTION INTERNAL FIXATION (ORIF) LEFT BIOMALLEOLAR FRACTURE;  Surgeon: Wylene Simmer, MD;  Location: [place];  Service: Orthopedics;  Laterality: Left;  . SMALL INTESTINE SURGERY  2012  . TUBAL LIGATION    . VAGINAL DELIVERY     x3, epidural anesth. with 2 of them  . WISDOM TOOTH EXTRACTION     Family History:  Family History  Problem Relation Age of Onset  . Diabetes Father   . Diabetes Other   . Hypertension Other   . COPD Paternal Grandmother   . COPD Maternal Aunt    Family Psychiatric  History: neg Social History:  Social History   Substance and Sexual Activity  Alcohol Use No     Social History   Substance and Sexual Activity  Drug Use No     Social History   Socioeconomic History  . Marital status: Married    Spouse name: [employer]  . Number of children: 2  . Years of education: Not on file  . Highest education level: Not on file  Occupational History  . Occupation: Security guard  Tobacco Use  . Smoking status: Never Smoker  . Smokeless tobacco: Never Used  Substance and Sexual Activity  . Alcohol use: No  . Drug use: No  . Sexual activity: Not on file  Other Topics Concern  . Not on file  Social History Narrative   Married, lives with spouse and grandkids (ages 77 and 31)   31 children   OCCUPATION: security and CNA   Social Determinants of Health   Financial Resource Strain:   . Difficulty of Paying Living Expenses: Not on file  Food Insecurity:   . Worried About Charity fundraiser in the Last Year: Not on file  . Ran Out of Food in the Last Year: Not on file  Transportation Needs:   . Lack of Transportation (Medical): Not on file  . Lack of Transportation (Non-Medical): Not on file  Physical Activity:   . Days of Exercise per Week: Not on file  . Minutes of Exercise per Session: Not on file  Stress:   . Feeling of Stress : Not on file  Social Connections:   .  Frequency of Communication with Friends and Family: Not on file  . Frequency of Social Gatherings with Friends and Family: Not on file  . Attends Religious Services: Not on file  . Active Member of Clubs or Organizations: Not on file  . Attends Banker Meetings: Not on file  . Marital Status: Not on file    Hospital Course:    Patient was admitted to the observation unit in anticipation of an inpatient admission.  However she was agitated when she came in because of the commitment process.  When I interviewed her she was alert fully oriented and cooperative insisting she had to get to work she denied suicidal thoughts homicidal thoughts acknowledge that she and her partner frequently argued but this did not rise to level of  dangerousness.  Denied violence.  She stated she was already followed by [facility] has a therapist takes Cymbalta and Vistaril for anxiety and depression but has never been diagnosed with a bipolar condition or psychotic condition and denied substance abuse   Musculoskeletal: Strength & Muscle Tone: within normal limits Gait & Station: normal Patient leans: N/A  Psychiatric Specialty Exam: Physical Exam  Review of Systems  Blood pressure (!) 163/126, pulse 95, temperature 98.1 F (36.7 C), temperature source Oral, resp. rate 18.There is no height or weight on file to calculate BMI.  General Appearance: Casual  Eye Contact:  Good  Speech:  Clear and Coherent  Volume:  Normal  Mood:  Euthymic  Affect:  Full Range  Thought Process:  Coherent and Goal Directed  Orientation:  Full (Time, Place, and Person)  Thought Content:  Logical  Suicidal Thoughts:  No  Homicidal Thoughts:  No  Memory:  Immediate;   Good Recent;   Good Remote;   Good  Judgement:  Good  Insight:  Fair  Psychomotor Activity:  Normal  Concentration:  Concentration: Fair and Attention Span: Fair  Recall:  [employer] of Knowledge:  Fair  Language:  Fair  Akathisia:  Negative  Handed:  Right  AIMS (if indicated):     Assets:  Desire for Improvement Physical Health Resilience Social Support Talents/Skills  ADL's:  Intact  Cognition:  WNL  Sleep:           Has this patient used any form of tobacco in the last 30 days? (Cigarettes, Smokeless Tobacco, Cigars, and/or Pipes) Yes, No  Blood Alcohol level:  No results found for: [facility]  Metabolic Disorder Labs:  Lab Results  Component Value Date   HGBA1C 5.8 (H) 10/05/2018   MPG 117 09/28/2016   No results found for: PROLACTIN Lab Results  Component Value Date   CHOL 184 10/05/2018   TRIG 140 10/05/2018   HDL 61 10/05/2018   CHOLHDL 3.0 10/05/2018   LDLCALC 95 10/05/2018    See Psychiatric Specialty Exam and Suicide Risk Assessment completed by  Attending Physician prior to discharge.  Discharge destination:  Home  Is patient on multiple antipsychotic therapies at discharge:  No   Has Patient had three or more failed trials of antipsychotic monotherapy by history:  No  Recommended Plan for Multiple Antipsychotic Therapies: NA   Allergies as of 12/22/2019   No Known Allergies     Medication List    TAKE these medications     Indication  albuterol 108 (90 Base) MCG/ACT inhaler Commonly known as: VENTOLIN HFA Inhale 1-2 puffs into the lungs every 6 (six) hours as needed for wheezing or shortness of breath.  Indication: Chronic  Obstructive Lung Disease   DULoxetine 30 MG capsule Commonly known as: Cymbalta Take 1 capsule (30 mg total) by mouth daily for 30 days.  Indication: Major Depressive Disorder   hydrOXYzine 25 MG tablet Commonly known as: ATARAX/VISTARIL Take 1 tablet (25 mg total) by mouth at bedtime as needed.  Indication: Feeling Anxious   oseltamivir 75 MG capsule Commonly known as: TAMIFLU Take 1 capsule (75 mg total) by mouth 2 (two) times daily.  Indication: Influenza A   sucralfate 1 g tablet Commonly known as: Carafate Take 1 tablet (1 g total) by mouth 4 (four) times daily -  with meals and at bedtime for 14 days.  Indication: Ulcer of the Duodenum   Symbicort 80-4.5 MCG/ACT inhaler Generic drug: budesonide-formoterol TAKE 2 PUFFS BY MOUTH TWICE A DAY  Indication: Asthma       Patient is released from petition I simply cannot make it hold up she is alert oriented cooperative without suicidal thoughts homicidal thoughts no mania no psychosis so forth.  

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
## 2019-12-31 DIAGNOSIS — F603 Borderline personality disorder: Secondary | ICD-10-CM | POA: Diagnosis not present

## 2019-12-31 DIAGNOSIS — F439 Reaction to severe stress, unspecified: Secondary | ICD-10-CM | POA: Diagnosis not present

## 2019-12-31 DIAGNOSIS — F319 Bipolar disorder, unspecified: Secondary | ICD-10-CM | POA: Diagnosis not present

## 2019-12-31 DIAGNOSIS — F4323 Adjustment disorder with mixed anxiety and depressed mood: Secondary | ICD-10-CM | POA: Diagnosis not present

## 2020-01-31 DIAGNOSIS — F4323 Adjustment disorder with mixed anxiety and depressed mood: Secondary | ICD-10-CM | POA: Diagnosis not present

## 2020-01-31 DIAGNOSIS — F319 Bipolar disorder, unspecified: Secondary | ICD-10-CM | POA: Diagnosis not present

## 2020-01-31 DIAGNOSIS — F603 Borderline personality disorder: Secondary | ICD-10-CM | POA: Diagnosis not present

## 2020-01-31 DIAGNOSIS — F439 Reaction to severe stress, unspecified: Secondary | ICD-10-CM | POA: Diagnosis not present
